# Patient Record
Sex: Female | Born: 1945
Health system: Southern US, Community
[De-identification: ages and names within clinical notes are randomized; demographics above are authoritative.]

## PROBLEM LIST (undated history)

## (undated) DIAGNOSIS — M8580 Other specified disorders of bone density and structure, unspecified site: Secondary | ICD-10-CM

## (undated) DIAGNOSIS — R945 Abnormal results of liver function studies: Secondary | ICD-10-CM

## (undated) DIAGNOSIS — R7989 Other specified abnormal findings of blood chemistry: Secondary | ICD-10-CM

## (undated) DIAGNOSIS — L719 Rosacea, unspecified: Secondary | ICD-10-CM

## (undated) DIAGNOSIS — F329 Major depressive disorder, single episode, unspecified: Secondary | ICD-10-CM

## (undated) DIAGNOSIS — E785 Hyperlipidemia, unspecified: Secondary | ICD-10-CM

## (undated) DIAGNOSIS — I10 Essential (primary) hypertension: Secondary | ICD-10-CM

## (undated) DIAGNOSIS — F32A Depression, unspecified: Secondary | ICD-10-CM

## (undated) HISTORY — PX: NASAL SEPTUM SURGERY: SHX37

## (undated) HISTORY — DX: Major depressive disorder, single episode, unspecified: F32.9

## (undated) HISTORY — DX: Other specified abnormal findings of blood chemistry: R79.89

## (undated) HISTORY — DX: Hyperlipidemia, unspecified: E78.5

## (undated) HISTORY — PX: COLONOSCOPY: SHX174

## (undated) HISTORY — DX: Depression, unspecified: F32.A

## (undated) HISTORY — DX: Other specified disorders of bone density and structure, unspecified site: M85.80

## (undated) HISTORY — DX: Rosacea, unspecified: L71.9

## (undated) HISTORY — DX: Abnormal results of liver function studies: R94.5

## (undated) HISTORY — DX: Essential (primary) hypertension: I10

---
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
## 1980-11-22 HISTORY — PX: ABDOMINAL HYSTERECTOMY: SHX81

## 1992-11-22 HISTORY — PX: ABDOMINOPLASTY: SUR9

## 2005-02-25 ENCOUNTER — Ambulatory Visit: Payer: Self-pay | Admitting: Internal Medicine

## 2005-04-01 ENCOUNTER — Ambulatory Visit: Payer: Self-pay | Admitting: Internal Medicine

## 2005-04-20 ENCOUNTER — Ambulatory Visit: Payer: Self-pay | Admitting: Internal Medicine

## 2005-04-28 ENCOUNTER — Encounter: Admission: RE | Admit: 2005-04-28 | Discharge: 2005-07-27 | Payer: Self-pay | Admitting: Internal Medicine

## 2005-05-04 ENCOUNTER — Other Ambulatory Visit: Admission: RE | Admit: 2005-05-04 | Discharge: 2005-05-04 | Payer: Self-pay | Admitting: Obstetrics and Gynecology

## 2005-11-18 ENCOUNTER — Ambulatory Visit: Payer: Self-pay | Admitting: Internal Medicine

## 2005-12-08 ENCOUNTER — Ambulatory Visit: Payer: Self-pay | Admitting: Internal Medicine

## 2006-03-23 ENCOUNTER — Ambulatory Visit: Payer: Self-pay | Admitting: Internal Medicine

## 2006-07-20 ENCOUNTER — Ambulatory Visit: Payer: Self-pay | Admitting: Internal Medicine

## 2006-07-22 ENCOUNTER — Encounter: Admission: RE | Admit: 2006-07-22 | Discharge: 2006-07-22 | Payer: Self-pay | Admitting: Obstetrics and Gynecology

## 2007-01-13 ENCOUNTER — Encounter: Admission: RE | Admit: 2007-01-13 | Discharge: 2007-01-13 | Payer: Self-pay | Admitting: Internal Medicine

## 2007-04-26 DIAGNOSIS — F329 Major depressive disorder, single episode, unspecified: Secondary | ICD-10-CM

## 2007-04-26 DIAGNOSIS — F3289 Other specified depressive episodes: Secondary | ICD-10-CM

## 2007-04-26 DIAGNOSIS — I10 Essential (primary) hypertension: Secondary | ICD-10-CM | POA: Insufficient documentation

## 2007-04-26 HISTORY — DX: Major depressive disorder, single episode, unspecified: F32.9

## 2007-04-26 HISTORY — DX: Other specified depressive episodes: F32.89

## 2007-10-02 ENCOUNTER — Ambulatory Visit: Payer: Self-pay | Admitting: Internal Medicine

## 2007-10-02 DIAGNOSIS — E785 Hyperlipidemia, unspecified: Secondary | ICD-10-CM

## 2007-10-05 ENCOUNTER — Ambulatory Visit: Payer: Self-pay | Admitting: Internal Medicine

## 2007-10-05 DIAGNOSIS — R740 Nonspecific elevation of levels of transaminase and lactic acid dehydrogenase [LDH]: Secondary | ICD-10-CM

## 2007-10-13 ENCOUNTER — Encounter (INDEPENDENT_AMBULATORY_CARE_PROVIDER_SITE_OTHER): Payer: Self-pay | Admitting: *Deleted

## 2007-10-13 LAB — CONVERTED CEMR LAB
Alkaline Phosphatase: 70 units/L (ref 39–117)
Cholesterol: 241 mg/dL (ref 0–200)
HDL: 44.8 mg/dL (ref >39.0)
Total CHOL/HDL Ratio: 5.4
Triglycerides: 261 mg/dL (ref 0–149)
VLDL: 52 mg/dL — ABNORMAL HIGH (ref 0–40)

## 2007-11-01 ENCOUNTER — Telehealth (INDEPENDENT_AMBULATORY_CARE_PROVIDER_SITE_OTHER): Payer: Self-pay | Admitting: *Deleted

## 2007-11-02 ENCOUNTER — Ambulatory Visit: Payer: Self-pay | Admitting: Internal Medicine

## 2007-11-21 ENCOUNTER — Encounter: Payer: Self-pay | Admitting: Internal Medicine

## 2007-11-27 ENCOUNTER — Ambulatory Visit: Payer: Self-pay | Admitting: Internal Medicine

## 2008-01-12 ENCOUNTER — Ambulatory Visit: Payer: Self-pay | Admitting: Internal Medicine

## 2008-04-01 ENCOUNTER — Ambulatory Visit: Payer: Self-pay | Admitting: Internal Medicine

## 2008-05-03 ENCOUNTER — Encounter: Admission: RE | Admit: 2008-05-03 | Discharge: 2008-05-03 | Payer: Self-pay | Admitting: Internal Medicine

## 2008-08-06 ENCOUNTER — Ambulatory Visit: Payer: Self-pay | Admitting: Internal Medicine

## 2008-08-07 LAB — CONVERTED CEMR LAB
Cholesterol: 230 mg/dL (ref 0–200)
Direct LDL: 146.3 mg/dL
Hgb A1c MFr Bld: 6 % (ref 4.6–6.0)
Triglycerides: 352 mg/dL (ref 0–149)

## 2008-08-08 ENCOUNTER — Encounter (INDEPENDENT_AMBULATORY_CARE_PROVIDER_SITE_OTHER): Payer: Self-pay | Admitting: *Deleted

## 2008-08-12 ENCOUNTER — Ambulatory Visit: Payer: Self-pay | Admitting: Internal Medicine

## 2008-08-12 DIAGNOSIS — R7309 Other abnormal glucose: Secondary | ICD-10-CM | POA: Insufficient documentation

## 2008-08-12 DIAGNOSIS — M899 Disorder of bone, unspecified: Secondary | ICD-10-CM | POA: Insufficient documentation

## 2008-08-12 DIAGNOSIS — M949 Disorder of cartilage, unspecified: Secondary | ICD-10-CM

## 2008-08-26 ENCOUNTER — Encounter: Payer: Self-pay | Admitting: Internal Medicine

## 2008-08-26 ENCOUNTER — Encounter: Admission: RE | Admit: 2008-08-26 | Discharge: 2008-08-26 | Payer: Self-pay | Admitting: Internal Medicine

## 2008-08-30 ENCOUNTER — Ambulatory Visit: Payer: Self-pay | Admitting: Internal Medicine

## 2008-09-06 ENCOUNTER — Telehealth (INDEPENDENT_AMBULATORY_CARE_PROVIDER_SITE_OTHER): Payer: Self-pay | Admitting: *Deleted

## 2008-09-23 ENCOUNTER — Telehealth (INDEPENDENT_AMBULATORY_CARE_PROVIDER_SITE_OTHER): Payer: Self-pay | Admitting: *Deleted

## 2008-09-24 ENCOUNTER — Ambulatory Visit: Payer: Self-pay | Admitting: Internal Medicine

## 2008-09-24 DIAGNOSIS — J45909 Unspecified asthma, uncomplicated: Secondary | ICD-10-CM | POA: Insufficient documentation

## 2008-10-01 ENCOUNTER — Telehealth (INDEPENDENT_AMBULATORY_CARE_PROVIDER_SITE_OTHER): Payer: Self-pay | Admitting: *Deleted

## 2009-02-05 ENCOUNTER — Encounter (INDEPENDENT_AMBULATORY_CARE_PROVIDER_SITE_OTHER): Payer: Self-pay | Admitting: *Deleted

## 2009-03-04 ENCOUNTER — Telehealth (INDEPENDENT_AMBULATORY_CARE_PROVIDER_SITE_OTHER): Payer: Self-pay | Admitting: *Deleted

## 2009-04-14 ENCOUNTER — Ambulatory Visit: Payer: Self-pay | Admitting: Internal Medicine

## 2009-04-18 ENCOUNTER — Encounter (INDEPENDENT_AMBULATORY_CARE_PROVIDER_SITE_OTHER): Payer: Self-pay | Admitting: *Deleted

## 2009-04-22 ENCOUNTER — Ambulatory Visit: Payer: Self-pay | Admitting: Internal Medicine

## 2009-04-22 DIAGNOSIS — E559 Vitamin D deficiency, unspecified: Secondary | ICD-10-CM | POA: Insufficient documentation

## 2009-04-22 LAB — CONVERTED CEMR LAB
ALT: 32 units/L (ref 0–35)
AST: 25 units/L (ref 0–37)
Albumin: 4 g/dL (ref 3.5–5.2)
Bilirubin, Direct: 0.1 mg/dL (ref 0.0–0.3)
HDL: 49.8 mg/dL (ref >39.00)
Hgb A1c MFr Bld: 6 % (ref 4.6–6.5)
Total Protein: 6.8 g/dL (ref 6.0–8.3)

## 2009-09-17 ENCOUNTER — Encounter: Admission: RE | Admit: 2009-09-17 | Discharge: 2009-09-17 | Payer: Self-pay | Admitting: Internal Medicine

## 2009-09-22 ENCOUNTER — Ambulatory Visit: Payer: Self-pay | Admitting: Internal Medicine

## 2009-10-30 ENCOUNTER — Ambulatory Visit: Payer: Self-pay | Admitting: Internal Medicine

## 2010-02-12 ENCOUNTER — Telehealth (INDEPENDENT_AMBULATORY_CARE_PROVIDER_SITE_OTHER): Payer: Self-pay | Admitting: *Deleted

## 2010-03-10 ENCOUNTER — Ambulatory Visit: Payer: Self-pay | Admitting: Internal Medicine

## 2010-03-16 ENCOUNTER — Ambulatory Visit: Payer: Self-pay | Admitting: Internal Medicine

## 2010-03-16 LAB — CONVERTED CEMR LAB
ALT: 23 units/L (ref 0–35)
Alkaline Phosphatase: 59 units/L (ref 39–117)
Cholesterol: 206 mg/dL — ABNORMAL HIGH (ref 0–200)
Direct LDL: 117 mg/dL
Total CHOL/HDL Ratio: 4
Total Protein: 6.9 g/dL (ref 6.0–8.3)
VLDL: 72.8 mg/dL — ABNORMAL HIGH (ref 0.0–40.0)

## 2010-04-14 ENCOUNTER — Telehealth (INDEPENDENT_AMBULATORY_CARE_PROVIDER_SITE_OTHER): Payer: Self-pay | Admitting: *Deleted

## 2010-04-17 ENCOUNTER — Telehealth: Payer: Self-pay | Admitting: Internal Medicine

## 2010-06-01 ENCOUNTER — Ambulatory Visit: Payer: Self-pay | Admitting: Internal Medicine

## 2010-06-11 LAB — CONVERTED CEMR LAB
Albumin: 4 g/dL (ref 3.5–5.2)
Alkaline Phosphatase: 43 units/L (ref 39–117)
Bilirubin, Direct: 0.2 mg/dL (ref 0.0–0.3)
HDL: 46.7 mg/dL (ref >39.00)
LDL Cholesterol: 99 mg/dL (ref 0–99)
Total CHOL/HDL Ratio: 4
VLDL: 32.4 mg/dL (ref 0.0–40.0)

## 2010-06-15 ENCOUNTER — Ambulatory Visit: Payer: Self-pay | Admitting: Internal Medicine

## 2010-11-25 ENCOUNTER — Ambulatory Visit
Admission: RE | Admit: 2010-11-25 | Discharge: 2010-11-25 | Payer: Self-pay | Source: Home / Self Care | Attending: Internal Medicine | Admitting: Internal Medicine

## 2010-12-13 ENCOUNTER — Encounter: Payer: Self-pay | Admitting: Obstetrics and Gynecology

## 2010-12-18 ENCOUNTER — Ambulatory Visit: Admit: 2010-12-18 | Payer: Self-pay | Admitting: Internal Medicine

## 2010-12-18 ENCOUNTER — Ambulatory Visit
Admission: RE | Admit: 2010-12-18 | Discharge: 2010-12-18 | Payer: Self-pay | Source: Home / Self Care | Attending: Internal Medicine | Admitting: Internal Medicine

## 2010-12-18 ENCOUNTER — Encounter: Payer: Self-pay | Admitting: Internal Medicine

## 2010-12-23 NOTE — Assessment & Plan Note (Signed)
Summary: rto 3 months/cbs   Vital Signs:  Patient profile:   65 year old female Weight:      188.4 pounds Pulse rate:   72 / minute Resp:     14 per minute BP sitting:   118 / 72  (left arm) Cuff size:   large  Vitals Entered By: Shonna Chock CMA (June 15, 2010 3:10 PM) CC: Follow-up visit: Discuss Labs (copy given)    CC:  Follow-up visit: Discuss Labs (copy given) .  History of Present Illness:  Hyperlipidemia Follow-Up      This is a 65 year old woman who presents for Hyperlipidemia follow-up on Trilipix alone. Labs reviewed & risks discussed.  The patient reports muscle aches and  some fatigue ( in context of traveling), but denies GI upset, abdominal pain, flushing, itching, constipation, and diarrhea.  Other symptoms include pedal edema.  The patient denies the following symptoms: chest pain/pressure, exercise intolerance, dypsnea, palpitations, and syncope.  Compliance with medications (by patient report) has been near 100%.  Dietary compliance has been affected by the traveling.  The patient reports exercising  5X/ week.    Allergies: 1)  ! Axid  Review of Systems Derm:  Complains of lesion(s); Lesion L popliteal space X 4 weeks . Cort Aid not helping.  Physical Exam  General:  well-nourished; alert,appropriate and cooperative throughout examination Lungs:  Normal respiratory effort, chest expands symmetrically. Lungs are clear to auscultation, no crackles or wheezes. Heart:  normal rate, regular rhythm, no gallop, no rub, no JVD, and grade 1 /6 systolic murmur.   Pulses:  R and L carotid,radial,dorsalis pedis and posterior tibial pulses are full and equal bilaterally Skin:  4x 8 mm papule L popliteal  space   Impression & Recommendations:  Problem # 1:  HYPERLIPIDEMIA (ICD-272.4)  The following medications were removed from the medication list:    Crestor 20 Mg Tabs (Rosuvastatin calcium) .Marland Kitchen... Daily as directed Her updated medication list for this problem  includes:    Trilipix 135 Mg Cpdr (Choline fenofibrate) .Marland Kitchen... 1 once daily  Problem # 2:  FASTING HYPERGLYCEMIA (ICD-790.29) A1c now 6.1%  Complete Medication List: 1)  Fish Oil Daily  2)  Vit D3 2000 Iuqd  3)  Caltrate 600 Plus D  4)  Coq10  5)  Trilipix 135 Mg Cpdr (Choline fenofibrate) .Marland Kitchen.. 1 once daily 6)  B Complex Vitamins Caps (B complex vitamins) .Marland Kitchen.. 1 by mouth once daily  Patient Instructions: 1)   Consume LESS THAN 30 grams of HFCS sugar/ day as discussed.See Dermatologist about the leg lesion.Please schedule a follow-up appointment in 6 months. 2)  Lipid Panel prior to visit, ICD-9:272.4 3)  HbgA1C prior to visit, ICD-9:790.29. Prescriptions: TRILIPIX 135 MG CPDR (CHOLINE FENOFIBRATE) 1 once daily  #90 x 1   Entered and Authorized by:   Marga Melnick MD   Signed by:   Marga Melnick MD on 06/15/2010   Method used:   Print then Give to Patient   RxID:   [id number]

## 2010-12-23 NOTE — Progress Notes (Signed)
Summary: pt lost hard copy of rx  Phone Note Refill Request Message from:  Fax from Pharmacy on Apr 14, 2010 2:08 PM  Refills Requested: Medication #1:  TRILIPIX 135 MG CPDR 1 once daily. fax [facility] - "pt is requesting cholestrol med has lost hard copy dr hopper gave her - pt saying it is not crestor" per ov [phone] patient was given rx for trilipix   Method Requested: Fax to Local Pharmacy Initial call taken by: Okey Regal [place],  Apr 14, 2010 2:13 PM    Prescriptions: TRILIPIX 135 MG CPDR (CHOLINE FENOFIBRATE) 1 once daily  #30 x 2   Entered by:   Jeremy Johann CMA   Authorized by:   Marga Melnick MD   Signed by:   Jeremy Johann CMA on 04/14/2010   Method used:   Re-Faxed to ...       [facility] [phone]* (retail)       [address] [place], [place]  [address]       Ph: [phone]       Fax: [phone]   RxID:   [id number]

## 2010-12-23 NOTE — Progress Notes (Signed)
Summary: REACTION TO MED  Phone Note Refill Request Call back at Home Phone [phone]   Refills Requested: Medication #1:  TRILIPIX 135 MG CPDR 1 once daily. PT LEFT VM STATING THAT MED IS CAUSING DIZZINESS, WEAKNESS, MUSCLE ACHE AND PAIN IN LEGS AND ARMS. PT HAS D/C MED FOR NOW PLS ADVISE  Initial call taken by: Jeremy Johann CMA,  Apr 17, 2010 12:24 PM  Follow-up for Phone Call        recommend referral to Lipid Clinic Follow-up by: Marga Melnick MD,  Apr 17, 2010 2:13 PM  Additional Follow-up for Phone Call Additional follow up Details #1::        pt aware, awaiting appt info................Marland KitchenFelecia Deloach CMA  Apr 17, 2010 2:47 PM

## 2010-12-23 NOTE — Progress Notes (Signed)
Summary: Triage: Impetigo  Phone Note Call from Patient Call back at Home Phone [phone]   Caller: Patient Summary of Call: Message left on VM: Patient with Impetigo, ? if she should see Dr.Hopper or her Dermatologist.  I called patient and recommended that she start with her Dermatologist since that is a skin conern. Patient to call back if additional questions or concerns.   Sharon Bauer  February 12, 2010 1:50 PM

## 2010-12-23 NOTE — Assessment & Plan Note (Signed)
Summary: 3 MONTH FOLLOWUP, AND TO DISCUSS LAABS///SPH   Vital Signs:  Patient profile:   65 year old female Weight:      188.2 pounds Pulse rate:   72 / minute Resp:     15 per minute BP sitting:   124 / 80  (left arm) Cuff size:   large  Vitals Entered By: Shonna Chock (March 16, 2010 3:26 PM) CC: Follow-up visit: Discuss Labs (copy given) Comments REVIEWED MED LIST, PATIENT AGREED DOSE AND INSTRUCTION CORRECT    CC:  Follow-up visit: Discuss Labs (copy given).  History of Present Illness: Labs reviewed , risks discussed. Food &  drink label reading  discussed . She drinks fruit smoothies & sweetened tea. She is no longer going to [employer] ; CVE as walking & swimming 5X/ week. On Crestor 20 mg once weekly LDL has  dropped from 179 to 117, but TG have risen to 364 from 260.  Allergies: 1)  ! Axid  Family History: Family History DM; M uncle MI @ 80; M aunt MI @ 63; M uncle MI @ 35; MGF DM  Social History: Alcohol use-yes-occasionally No specific diet  Review of Systems CV:  Denies chest pain or discomfort, leg cramps with exertion, palpitations, and shortness of breath with exertion.  Physical Exam  General:  well-nourished; alert,appropriate and cooperative throughout examination Heart:  Normal rate and regular rhythm. S1 and S2 normal without gallop, murmur, click, rub.S4 Pulses:  R and L carotid,radial,dorsalis pedis and posterior tibial pulses are full and equal bilaterally Skin:  Intact without suspicious lesions or rashes Psych:  memory intact for recent and remote, normally interactive, and good eye contact.     Impression & Recommendations:  Problem # 1:  HYPERLIPIDEMIA (ICD-272.4) LDL 117 ; TG 364 Her updated medication list for this problem includes:    Crestor 20 Mg Tabs (Rosuvastatin calcium) .Marland Kitchen... Daily as directed    Trilipix 135 Mg Cpdr (Choline fenofibrate) .Marland Kitchen... 1 once daily  Complete Medication List: 1)  Diltiazem Hcl Cr 240 Mg Cp24  (Diltiazem hcl) .Marland Kitchen.. 1 by mouth once daily 2)  Fish Oil Daily  3)  Vit D3 2000 Iuqd  4)  Caltrate 600 Plus D  5)  Coq10  6)  Xango  7)  Crestor 20 Mg Tabs (Rosuvastatin calcium) .... Daily as directed 8)  Trilipix 135 Mg Cpdr (Choline fenofibrate) .Marland Kitchen.. 1 once daily  Patient Instructions: 1)  Hold Crestor 20 mg once weekly.Consume < 25  grams of sugar from  HFCS  sources as discussed. 2)  Please schedule a follow-up appointment in 3 months. 3)  Hepatic Panel prior to visit, ICD-9:995.20,790.4 4)  Lipid Panel prior to visit, ICD-9:272.4 5)  HbgA1C prior to visit, ICD-9:790.29 Prescriptions: TRILIPIX 135 MG CPDR (CHOLINE FENOFIBRATE) 1 once daily  #30 x 2   Entered and Authorized by:   Marga Melnick MD   Signed by:   Marga Melnick MD on 03/16/2010   Method used:   Print then Give to Patient   RxID:   [id number]

## 2010-12-24 NOTE — Assessment & Plan Note (Signed)
Summary: both ears "oozy" and starting to hurt///sph   Vital Signs:  Patient profile:   65 year old female Height:      64.5 inches Weight:      187 pounds BMI:     31.72 Temp:     98.4 degrees F oral Pulse rate:   64 / minute Resp:     15 per minute BP sitting:   128 / 84  (left arm) Cuff size:   large  Vitals Entered By: Shonna Chock CMA (November 25, 2010 1:40 PM) CC: Ear concerns: itchy, ozzy, and crusted over , Ear pain   CC:  Ear concerns: itchy, ozzy, and crusted over , and Ear pain.  History of Present Illness:      This is a 65 year old woman who presents with dull  R ear pain as of 11/24/2010 .  The patient also reports  bilateral ear discharge X 1-2  weeks  and slight  tinnitus, but denies hearing loss, fever, sinus pain, and nasal discharge. She had dry & itching canals 2 months ago; a  topical agent was Rxed by Vaughan Sine.  Subsequently she has  noted wet, "oozy" discharge followed by crystallizing of  material oin the canals.  She has occasional  dizziness;the patient denies vertigo.     Current Medications (verified): 1)  Fish Oil Daily 2)  Vit D3 2000 Iuqd 3)  Caltrate 600 Plus D 4)  Coq10 5)  Trilipix 135 Mg Cpdr (Choline Fenofibrate) .Marland Kitchen.. 1 Once Daily 6)  B Complex Vitamins  Caps (B Complex Vitamins) .Marland Kitchen.. 1 By Mouth Once Daily  Allergies: 1)  ! Axid  Review of Systems Derm:  Denies changes in nail beds, dryness, and hair loss.  Physical Exam  General:  well-nourished;alert,appropriate and cooperative throughout examination Ears:  External ear exam shows no significant lesions or deformities.  Otoscopic examination reveals clear canals, tympanic membranes are intact bilaterally without bulging, retraction, inflammation or discharge. Hearing is grossly normal bilaterally to whisper @ 6 ft.Rinne  (BC>AC) and Weber  both normal.   Nose:  External nasal examination shows no deformity or inflammation. Nasal mucosa are pink and moist without lesions or exudates. Septal  dislocation Mouth:  Oral mucosa and oropharynx without lesions or exudates.  Teeth in good repair. Cervical Nodes:  No lymphadenopathy noted Axillary Nodes:  No palpable lymphadenopathy   Impression & Recommendations:  Problem # 1:  EAR PAIN, RIGHT (ICD-388.70) negative exam  Complete Medication List: 1)  Fish Oil Daily  2)  Vit D3 2000 Iuqd  3)  Caltrate 600 Plus D  4)  Coq10  5)  Trilipix 135 Mg Cpdr (Choline fenofibrate) .Marland Kitchen.. 1 once daily 6)  B Complex Vitamins Caps (B complex vitamins) .Marland Kitchen.. 1 by mouth once daily  Patient Instructions: 1)  Mix 1 part Eucerin to 1 part OTC Cort Aid once daily - two times a day as needed for dry & itching canals. Shower with moisturing gel. T gel shampoo 2-3 X /week  for  itchy scalp. At next fasting Lipid study ; have a  Boston Heart Panel ( 1304X). Code : 272.4.   Orders Added: 1)  Est. Patient Level III [[address]]

## 2011-01-12 ENCOUNTER — Encounter: Payer: Self-pay | Admitting: Internal Medicine

## 2011-01-12 ENCOUNTER — Encounter (INDEPENDENT_AMBULATORY_CARE_PROVIDER_SITE_OTHER): Payer: Medicare Other | Admitting: Internal Medicine

## 2011-01-12 DIAGNOSIS — E785 Hyperlipidemia, unspecified: Secondary | ICD-10-CM

## 2011-01-12 DIAGNOSIS — I10 Essential (primary) hypertension: Secondary | ICD-10-CM

## 2011-01-12 DIAGNOSIS — Z0000 Encounter for general adult medical examination without abnormal findings: Secondary | ICD-10-CM

## 2011-01-18 ENCOUNTER — Encounter (INDEPENDENT_AMBULATORY_CARE_PROVIDER_SITE_OTHER): Payer: Self-pay | Admitting: *Deleted

## 2011-01-19 NOTE — Letter (Signed)
Summary: [employer] Personalized Risk Profile  [employer] Personalized Risk Profile   Imported By: Maryln Gottron 01/15/2011 09:48:57  _____________________________________________________________________  External Attachment:    Type:   Image     Comment:   External Document

## 2011-01-19 NOTE — Assessment & Plan Note (Signed)
Summary: first medicare cpx,discuss labs//sph   Vital Signs:  Patient profile:   65 year old female Height:      64.5 inches Weight:      187.6 pounds BMI:     31.82 Temp:     98.4 degrees F oral Pulse rate:   63 / minute Resp:     14 per minute BP sitting:   152 / 95  (left arm) Cuff size:   large  Vitals Entered By: Shonna Chock CMA (January 12, 2011 1:03 PM) CC: CPX and discuss labs (Patient with [facility])  Vision Screening:Left eye with correction: 20 / 50 Right eye with correction: 20 / 40 Both eyes with correction: 20 / 40       Vision Comments: Patient had recent eye exam and RX changed for glasses. Eyes tested today with old RX  Vision Entered By: Shonna Chock CMA (January 12, 2011 1:12 PM)   CC:  CPX and discuss labs (Patient with [facility]).  History of Present Illness: Here for Medicare AWV: 1.Risk factors based on Past M, S, F history:see Diagnoses; chart updated 2.Physical Activities:walking 4-5 X/ week & Body Flow 1-2X/ week (Tai Chi, Palates, yoga) 3.Depression/mood: no issues 4.Hearing: whisper heard @ 6 ft 5.ADL's: no limitations 6.Fall Risk: balance improved with #2 7.Home Safety: safety proofed 8.Height, weight, &visual acuity:see VS 9.Counseling: POA & Living may need updated 10.Labs ordered based on risk factors: [facility] Heart Pane reviewed & risks discussed 11.Referral Coordination: mammograms schedued; ?  colonoscopy status  12.  Care Plan: see Intructions 13.Cognitive Assessment: Oriented X 3; memory & recalll  good   ; "WORLD" spelled backwards; mood & affect normal.    Hyperlipidemia Follow-Up:She reports muscle aches and flushing ( not on Niacin; she has rosacea) , but denies GI upset, abdominal pain, itching, constipation, diarrhea, and fatigue.  The patient denies the following symptoms: chest pain/pressure, exercise intolerance, dypsnea, palpitations, syncope, and pedal edema.  Compliance with medications (by patient  report) has been near 100%.  Dietary compliance has been poor.  Adjunctive measures currently used by the patient include fiber, fish oil supplements, and Co-Q 10.     See BP; inpast she had been on meds for HTN.  The patient reports some frontal  headaches, but denies lightheadedness and urinary frequency.  Adjunctive measures currently used by the patient include salt restriction.  BP not monitored @ home.  Preventive Screening-Counseling & Management  Alcohol-Tobacco     Alcohol drinks/day: <1     Smoking Status: never  Caffeine-Diet-Exercise     Caffeine use/day: 1 cup green tea / day  Hep-HIV-STD-Contraception     Dental Visit-last 6 months yes     Sun Exposure-Excessive: no  Safety-Violence-Falls     Seat Belt Use: yes     Smoke Detectors: yes      Blood Transfusions:  no.        Travel History:  2002 [place].    Current Medications (verified): 1)  Fish Oil Daily 2)  Vit D3 2000 Iuqd 3)  Caltrate 600 Plus D 4)  Coq10 5)  Trilipix 135 Mg Cpdr (Choline Fenofibrate) .Marland Kitchen.. 1 Once Daily 6)  B Complex Vitamins  Caps (B Complex Vitamins) .Marland Kitchen.. 1 By Mouth Once Daily 7)  Multivitamins  Tabs (Multiple Vitamin) .Marland Kitchen.. 1 By Mouth Once Daily  Allergies: 1)  ! Axid  Past History:  Past Medical History: Depression, PMH of situational ( divorce) Hypertension Hyperlipidemia: LDL 179 ( 3399/ 2504), HDL  43, TG 267. Elevated LFTs, PMH of  Osteopenia: T score -2.4 @ [facility] spine 2006 ; Rosacea   Past Surgical History:  G 2 P 2 , Caesarean section X 1 Hysterectomy  1982  for fibroids Tonsillectomy  1960 Abdominoplasty 1994  Family History:  M uncle: MI @ 28; M aunt:  MI @ 60; M uncle: MI @ 56; MGF: DM; PGM: colon cancer;2 P aunts: asthma;M aunt : Alzheimer's Father: MI in 41s, CHF,COPD, HTN Mother: HTN, dyslipidemia, Osteoporosis, OA Siblings: sister : elevated  lipids  Social History: Alcohol use-yes: rarely Divorced Caffeine use/day:  1 cup green tea / day Dental Care w/in 6  mos.:  yes Sun Exposure-Excessive:  no Seat Belt Use:  yes Blood Transfusions:  no  Review of Systems  The patient denies anorexia, fever, weight loss, weight gain, vision loss, decreased hearing, hoarseness, prolonged cough, hemoptysis, melena, hematochezia, severe indigestion/heartburn, hematuria, suspicious skin lesions, unusual weight change, abnormal bleeding, enlarged lymph nodes, and angioedema.         Occasional choking especially with black papper  Physical Exam  General:  well-nourished;alert,appropriate and cooperative throughout examination Head:  Normocephalic and atraumatic without obvious abnormalities. Eyes:  No corneal or conjunctival inflammation noted. Perrla. Funduscopic exam benign, without hemorrhages, exudates or papilledema.  Ears:  External ear exam shows no significant lesions or deformities.  Otoscopic examination reveals clear canals, tympanic membranes are intact bilaterally without bulging, retraction, inflammation or discharge. Hearing is grossly normal bilaterally. Nose:  External nasal examination shows no deformity or inflammation. Nasal mucosa are pink and moist without lesions or exudates. Mouth:  Oral mucosa and oropharynx without lesions or exudates.  Teeth in good repair. Venous nevi of lip Neck:  No deformities, masses, or tenderness noted. Lungs:  Normal respiratory effort, chest expands symmetrically. Lungs are clear to auscultation, no crackles or wheezes. Heart:  normal rate, regular rhythm, no gallop, no rub, no JVD, no HJR, and grade  1/6 systolic murmur.   Abdomen:  Bowel sounds positive,abdomen soft and non-tender without masses, organomegaly or hernias noted. Genitalia:  Dr Rana Snare Msk:  lordosis of upper back Pulses:  R and L carotid,radial,dorsalis pedis and posterior tibial pulses are full and equal bilaterally Extremities:  No clubbing, cyanosis, edema, or deformity noted with normal full range of motion of all joints.   Neurologic:  alert  & oriented X3 and DTRs symmetrical and normal.   Skin:  Intact without suspicious lesions or rashes Cervical Nodes:  No lymphadenopathy noted Axillary Nodes:  No palpable lymphadenopathy Psych:  memory intact for recent and remote, normally interactive, good eye contact, not anxious appearing, and not depressed appearing.     Impression & Recommendations:  Problem # 1:  PREVENTIVE HEALTH CARE (ICD-V70.0)  Orders: Medicare -1st Annual Wellness Visit [phone])  Problem # 2:  HYPERLIPIDEMIA (ICD-272.4)  Her updated medication list for this problem includes:    Trilipix 135 Mg Cpdr (Choline fenofibrate) .Marland Kitchen... 1 once daily  Problem # 3:  HYPERTENSION (ICD-401.9)  Orders: EKG w/ Interpretation (93000)  Her updated medication list for this problem includes:    Ramipril 5 Mg Caps (Ramipril) .Marland Kitchen... 1 once daily  Problem # 4:  REACTIVE AIRWAY DISEASE (ICD-493.90) quiescent  Problem # 5:  VITAMIN D DEFICIENCY (ICD-268.9) monitor vit D annually  Problem # 6:  OSTEOPENIA (ICD-733.90) BMD every 25 months  Complete Medication List: 1)  Fish Oil Daily  2)  Vit D3 2000 Iuqd  3)  Caltrate 600 Plus D  4)  Coq10  5)  Trilipix 135 Mg Cpdr (Choline fenofibrate) .Marland Kitchen.. 1 once daily 6)  B Complex Vitamins Caps (B complex vitamins) .Marland Kitchen.. 1 by mouth once daily 7)  Multivitamins Tabs (Multiple vitamin) .Marland Kitchen.. 1 by mouth once daily 8)  Ramipril 5 Mg Caps (Ramipril) .Marland Kitchen.. 1 once daily  Other Orders: Gastroenterology Referral (GI)   Patient Instructions: 1)  Take calcium  600 mg twice a day +Vitamin D daily.Check vit D level annually. 2)  It is important that you exercise regularly at least 20 minutes 5 times a week. If you develop chest pain, have severe difficulty breathing, or feel very tired , stop exercising immediately and seek medical attention. 3)  Take an  coated 81 mg Aspirin every day.BMD every 25 months. Avoid HFCS sugar & follow a low carb program as discussed. 4)  Please schedule a  follow-up appointment in 6 months for : vit D level, 268.9; 5)  Hepatic Panel ,  995.20; BUN, creat, 401.9; 6)  Lipid Panel , :272.4 7)  Check your Blood Pressure regularly. If it is above: 135/85 ON AVERAGE  you should make an appointment. Prescriptions: TRILIPIX 135 MG CPDR (CHOLINE FENOFIBRATE) 1 once daily  #90 x 1   Entered and Authorized by:   Marga Melnick MD   Signed by:   Marga Melnick MD on 01/12/2011   Method used:   Print then Give to Patient   RxID:   [id number] RAMIPRIL 5 MG CAPS (RAMIPRIL) 1 once daily  #30 x 5   Entered and Authorized by:   Marga Melnick MD   Signed by:   Marga Melnick MD on 01/12/2011   Method used:   Print then Give to Patient   RxID:   [id number]    Orders Added: 1)  Medicare -1st Annual Wellness Visit [G0438] 2)  Est. Patient Level III [84696] 3)  EKG w/ Interpretation [93000] 4)  Gastroenterology Referral [GI]

## 2011-01-28 NOTE — Letter (Signed)
Summary: Pre Visit Letter Revised  [facility]  [address] [place], [place] [address]   Phone: [phone]  Fax: [phone]        01/18/2011 MRN: [id number] [facility] [address] [place], [place]  [address]  [place]             Procedure Date:  02-17-11           Direct Colon--Dr. Christella Hartigan   Welcome to the Gastroenterology Division at [facility].    You are scheduled to see a nurse for your pre-procedure visit on 02-03-11 at 1:00p.m. on the 3rd floor at [facility], [address]  We ask that you try to arrive at our office 15 minutes prior to your appointment time to allow for check-in.  Please take a minute to review the attached form.  If you answer "Yes" to one or more of the questions on the first page, we ask that you call the person listed at your earliest opportunity.  If you answer "No" to all of the questions, please complete the rest of the form and bring it to your appointment.    Your nurse visit will consist of discussing your medical and surgical history, your immediate family medical history, and your medications.   If you are unable to list all of your medications on the form, please bring the medication bottles to your appointment and we will list them.  We will need to be aware of both prescribed and over the counter drugs.  We will need to know exact dosage information as well.    Please be prepared to read and sign documents such as consent forms, a financial agreement, and acknowledgement forms.  If necessary, and with your consent, a friend or relative is welcome to sit-in on the nurse visit with you.  Please bring your insurance card so that we may make a copy of it.  If your insurance requires a referral to see a specialist, please bring your referral form from your primary care physician.  No co-pay is required for this nurse visit.     If you cannot keep your appointment, please call [phone] to cancel or reschedule  prior to your appointment date.  This allows [place] the opportunity to schedule an appointment for another patient in need of care.    Thank you for choosing [facility] for your medical needs.  We appreciate the opportunity to care for you.  Please visit [place] at our website  to learn more about our practice.  Sincerely, The Gastroenterology Division

## 2011-02-17 ENCOUNTER — Other Ambulatory Visit: Payer: Medicare Other | Admitting: Gastroenterology

## 2011-03-12 ENCOUNTER — Other Ambulatory Visit: Payer: Medicare Other | Admitting: Gastroenterology

## 2011-03-15 ENCOUNTER — Other Ambulatory Visit: Payer: Self-pay | Admitting: Internal Medicine

## 2011-03-16 ENCOUNTER — Encounter: Payer: Self-pay | Admitting: *Deleted

## 2011-03-16 ENCOUNTER — Ambulatory Visit (INDEPENDENT_AMBULATORY_CARE_PROVIDER_SITE_OTHER): Payer: Medicare Other | Admitting: Family Medicine

## 2011-03-16 DIAGNOSIS — H60549 Acute eczematoid otitis externa, unspecified ear: Secondary | ICD-10-CM

## 2011-03-16 DIAGNOSIS — J310 Chronic rhinitis: Secondary | ICD-10-CM

## 2011-03-16 DIAGNOSIS — H60509 Unspecified acute noninfective otitis externa, unspecified ear: Secondary | ICD-10-CM

## 2011-03-16 MED ORDER — FLUOCINOLONE ACETONIDE 0.01 % OT OIL: 5.0000 [drp] | TOPICAL_OIL | Freq: Two times a day (BID) | OTIC | Status: DC

## 2011-03-16 MED ORDER — FLUTICASONE PROPIONATE 50 MCG/ACT NA SUSP: 2.0000 | Freq: Every day | NASAL | Status: DC

## 2011-03-16 NOTE — Patient Instructions (Signed)
This appears to be allergy related Start the nasal spray- 2 sprays each nostril Use the ear drops for 2 weeks and then as needed for itching and drainage Start the allergy meds ~ mid February - mid may and then mid august until the 1st frost to cover for the spring and fall allergy seasons Call with any questions or concerns Sherri Rad in there!

## 2011-03-16 NOTE — Progress Notes (Signed)
  Subjective:    Patient ID: Sharon Bauer, female    DOB: 02/08/1946, 65 y.o.   MRN: [id number]  HPI Ears 'oozing'- has had this problem previously (seen in January for this).  Reports ears are now 'stopped up from the liquid'.  Reports in last few weeks drainage has increased.  Drainage ranges from clear to pale yellow.  When she sticks a tissue in her ear she will note a spot of blood on the tissue.  Will have pain w/ manipulation of outer ear.  No fevers.  + PND.   Review of Systems For ROS see HPI     Objective:   Physical Exam  Constitutional: She appears well-developed and well-nourished. No distress.  HENT:  Head: Normocephalic and atraumatic.  Right Ear: Tympanic membrane normal.  Left Ear: Tympanic membrane normal.  Nose: Mucosal edema, rhinorrhea and septal deviation present.       External ears and proximal canals show eczematous changes w/ serous drainage and crusting.  + excoriations w/out evidence of infxn  + PND and turbinate edema          Assessment & Plan:

## 2011-03-17 DIAGNOSIS — J310 Chronic rhinitis: Secondary | ICD-10-CM | POA: Insufficient documentation

## 2011-03-17 DIAGNOSIS — H60549 Acute eczematoid otitis externa, unspecified ear: Secondary | ICD-10-CM | POA: Insufficient documentation

## 2011-03-17 NOTE — Assessment & Plan Note (Signed)
Pt's PND and turbinate edema are most likely due to seasonal allergies.  Start nasal steroid.

## 2011-03-17 NOTE — Assessment & Plan Note (Signed)
Pt's serous drainage and 'crusting' appear to be eczematous in nature.  TMs normal.  No evidence of otitis externa.  Start steroid ear drops for sxs relief.  Reviewed supportive care and red flags that should prompt return.  Pt expressed understanding and is in agreement w/ plan.

## 2011-04-30 ENCOUNTER — Other Ambulatory Visit: Payer: Self-pay | Admitting: Internal Medicine

## 2011-04-30 ENCOUNTER — Encounter: Payer: Self-pay | Admitting: Internal Medicine

## 2011-04-30 DIAGNOSIS — Z1231 Encounter for screening mammogram for malignant neoplasm of breast: Secondary | ICD-10-CM

## 2011-05-07 ENCOUNTER — Telehealth: Payer: Self-pay

## 2011-05-07 ENCOUNTER — Ambulatory Visit
Admission: RE | Admit: 2011-05-07 | Discharge: 2011-05-07 | Disposition: A | Discharge status: Home / Self Care | Payer: Medicare Other | Source: Ambulatory Visit | Attending: Internal Medicine | Admitting: Internal Medicine

## 2011-05-07 DIAGNOSIS — Z1231 Encounter for screening mammogram for malignant neoplasm of breast: Secondary | ICD-10-CM

## 2011-05-07 DIAGNOSIS — Z780 Asymptomatic menopausal state: Secondary | ICD-10-CM

## 2011-05-07 NOTE — Telephone Encounter (Signed)
Left msg on voicemail that she was due for bone density would like to scheduled has pending appt for 06/2011 sent to the [facility]

## 2011-06-01 ENCOUNTER — Ambulatory Visit
Admission: RE | Admit: 2011-06-01 | Discharge: 2011-06-01 | Disposition: A | Discharge status: Home / Self Care | Payer: Medicare Other | Source: Ambulatory Visit | Attending: Internal Medicine | Admitting: Internal Medicine

## 2011-06-01 ENCOUNTER — Ambulatory Visit (AMBULATORY_SURGERY_CENTER): Payer: Medicare Other

## 2011-06-01 VITALS — Ht 65.0 in | Wt 183.9 lb

## 2011-06-01 DIAGNOSIS — Z800 Family history of malignant neoplasm of digestive organs: Secondary | ICD-10-CM

## 2011-06-01 DIAGNOSIS — Z780 Asymptomatic menopausal state: Secondary | ICD-10-CM

## 2011-06-01 MED ORDER — PEG-KCL-NACL-NASULF-NA ASC-C 100 G PO SOLR: 1.0000 | Freq: Once | ORAL | Status: AC

## 2011-06-15 ENCOUNTER — Encounter: Payer: Self-pay | Admitting: Internal Medicine

## 2011-06-17 ENCOUNTER — Other Ambulatory Visit: Payer: Medicare Other | Admitting: Internal Medicine

## 2011-07-13 ENCOUNTER — Other Ambulatory Visit: Payer: Medicare Other

## 2011-07-15 ENCOUNTER — Ambulatory Visit (AMBULATORY_SURGERY_CENTER): Payer: Medicare Other | Admitting: Internal Medicine

## 2011-07-15 ENCOUNTER — Other Ambulatory Visit: Payer: Self-pay | Admitting: Internal Medicine

## 2011-07-15 ENCOUNTER — Encounter: Payer: Self-pay | Admitting: Internal Medicine

## 2011-07-15 VITALS — BP 158/73 | HR 64 | Temp 98.3°F | Resp 20 | Ht 65.0 in | Wt 180.0 lb

## 2011-07-15 DIAGNOSIS — Z1211 Encounter for screening for malignant neoplasm of colon: Secondary | ICD-10-CM

## 2011-07-15 MED ORDER — SODIUM CHLORIDE 0.9 % IV SOLN: 500.0000 mL | INTRAVENOUS | Status: DC

## 2011-07-15 NOTE — Patient Instructions (Signed)
Discharge instructions reviewed with patient and care partner.  Impressions/Recommendations:  Diverticulosis (handout given)  High fiber diet handout given.  Repeat exam in 10 years.  Continue medications as you were taking them prior to your procedure.

## 2011-07-16 ENCOUNTER — Telehealth: Payer: Self-pay | Admitting: *Deleted

## 2011-07-16 NOTE — Telephone Encounter (Signed)

## 2011-07-20 ENCOUNTER — Ambulatory Visit: Payer: Medicare Other | Admitting: Internal Medicine

## 2011-08-19 ENCOUNTER — Other Ambulatory Visit: Payer: Self-pay | Admitting: Internal Medicine

## 2011-08-19 DIAGNOSIS — E559 Vitamin D deficiency, unspecified: Secondary | ICD-10-CM

## 2011-08-19 DIAGNOSIS — T887XXA Unspecified adverse effect of drug or medicament, initial encounter: Secondary | ICD-10-CM

## 2011-08-19 DIAGNOSIS — I10 Essential (primary) hypertension: Secondary | ICD-10-CM

## 2011-08-19 DIAGNOSIS — E785 Hyperlipidemia, unspecified: Secondary | ICD-10-CM

## 2011-08-20 ENCOUNTER — Other Ambulatory Visit: Payer: Medicare Other

## 2011-08-20 DIAGNOSIS — Z0289 Encounter for other administrative examinations: Secondary | ICD-10-CM

## 2011-08-20 NOTE — Progress Notes (Signed)
12  

## 2011-08-27 ENCOUNTER — Ambulatory Visit: Payer: Medicare Other | Admitting: Internal Medicine

## 2011-09-10 ENCOUNTER — Telehealth: Payer: Self-pay | Admitting: Internal Medicine

## 2011-09-10 ENCOUNTER — Other Ambulatory Visit: Payer: Self-pay | Admitting: Internal Medicine

## 2011-09-10 NOTE — Telephone Encounter (Signed)
The pt called requesting lab work on oct 31 for 4 month follow up.  Needs orders

## 2011-09-10 NOTE — Telephone Encounter (Signed)
Copied from 01-12-11 patient instructions:   Please schedule a follow-up appointment in 6 months for :  vit D level, 268.9; Hepatic Panel , 995.20; BUN, creat, 401.9;  Lipid Panel , :272.4

## 2011-09-22 ENCOUNTER — Other Ambulatory Visit: Payer: Medicare Other

## 2011-09-27 ENCOUNTER — Other Ambulatory Visit (INDEPENDENT_AMBULATORY_CARE_PROVIDER_SITE_OTHER): Payer: Medicare Other

## 2011-09-27 DIAGNOSIS — E559 Vitamin D deficiency, unspecified: Secondary | ICD-10-CM

## 2011-09-27 DIAGNOSIS — T887XXA Unspecified adverse effect of drug or medicament, initial encounter: Secondary | ICD-10-CM

## 2011-09-27 DIAGNOSIS — E785 Hyperlipidemia, unspecified: Secondary | ICD-10-CM

## 2011-09-27 DIAGNOSIS — I10 Essential (primary) hypertension: Secondary | ICD-10-CM

## 2011-09-27 LAB — CREATININE, SERUM: Creatinine, Ser: 1 mg/dL (ref 0.4–1.2)

## 2011-09-27 LAB — BUN: BUN: 20 mg/dL (ref 6–23)

## 2011-09-27 LAB — LIPID PANEL
HDL: 56.3 mg/dL (ref >39.00)
Triglycerides: 131 mg/dL (ref 0.0–149.0)

## 2011-09-27 LAB — HEPATIC FUNCTION PANEL
AST: 35 U/L (ref 0–37)
Total Bilirubin: 0.6 mg/dL (ref 0.3–1.2)

## 2011-09-28 ENCOUNTER — Other Ambulatory Visit: Payer: Self-pay | Admitting: Internal Medicine

## 2011-09-28 DIAGNOSIS — T887XXA Unspecified adverse effect of drug or medicament, initial encounter: Secondary | ICD-10-CM

## 2011-09-28 DIAGNOSIS — E559 Vitamin D deficiency, unspecified: Secondary | ICD-10-CM

## 2011-09-28 DIAGNOSIS — E785 Hyperlipidemia, unspecified: Secondary | ICD-10-CM

## 2011-09-28 DIAGNOSIS — I10 Essential (primary) hypertension: Secondary | ICD-10-CM

## 2011-09-28 LAB — VITAMIN D 25 HYDROXY (VIT D DEFICIENCY, FRACTURES): Vit D, 25-Hydroxy: 49 ng/mL (ref 30–89)

## 2011-09-29 ENCOUNTER — Other Ambulatory Visit: Payer: Medicare Other

## 2011-09-29 ENCOUNTER — Ambulatory Visit: Payer: Medicare Other | Admitting: Internal Medicine

## 2011-09-29 NOTE — Progress Notes (Signed)
Labs only

## 2011-10-06 ENCOUNTER — Encounter: Payer: Self-pay | Admitting: Internal Medicine

## 2011-10-07 ENCOUNTER — Ambulatory Visit (INDEPENDENT_AMBULATORY_CARE_PROVIDER_SITE_OTHER): Payer: Medicare Other | Admitting: Internal Medicine

## 2011-10-07 VITALS — BP 114/72 | HR 63 | Temp 98.6°F | Wt 182.4 lb

## 2011-10-07 DIAGNOSIS — I10 Essential (primary) hypertension: Secondary | ICD-10-CM

## 2011-10-07 DIAGNOSIS — R1310 Dysphagia, unspecified: Secondary | ICD-10-CM

## 2011-10-07 DIAGNOSIS — E781 Pure hyperglyceridemia: Secondary | ICD-10-CM

## 2011-10-07 MED ORDER — TRILIPIX 135 MG PO CPDR: 135.0000 mg | DELAYED_RELEASE_CAPSULE | Freq: Every day | ORAL | Status: DC

## 2011-10-07 NOTE — Progress Notes (Signed)
  Subjective:    Patient ID: Sharon Bauer, female    DOB: 26-Jul-1946, 65 y.o.   MRN: [id number]  HPI Dyslipidemia assessment: on Trilipix Prior Advanced Lipid Testing: [facility].   Family history of premature CAD/ MI: M aunt MI pre 21 .  Nutrition: not eating out as much; heart healthy diet .  Exercise: walking 30 min  2-3X/ week . Diabetes : no but fasting hyperglycemia . HTN: not monitored. Smoking history  : never .   Weight :  Down > 10#.  Lab results reviewed :dramatic improvement in TG. Her ALT is minimally elevated at 39.  She denies excess vitamin A, Tylenol, or alcohol intake.    Review of Systems Abd pain, bowel changes- some constipations   Muscle aches- no pain but leg and weakness.  She describes frequent coughing  with meals w/o dyspepsia or frank dysphagia. She has a past history of hives and itching with Axid. She is on an ACE inhibitor and has no angioedema symptoms     Objective:   Physical Exam she appears healthy and well-nourished  She has no scleral icterus or jaundice  The oral pharynx  reveals good dental hygiene with no oral pharyngeal erythema  She has no lymphadenopathy about the neck or axilla Chest is clear with no increased work of breathing, rales, or rhonchi.  Chest regular rhythm with an S4.  All pulses are intact with no deficit  She's alert and oriented x3  Strength is excellent in the lower extremities; deep tendon reflexes are normal        Assessment & Plan:  #1 dyslipidemia; dramatic improvement as noted  #2 hypertension, excellent control  #3 insignificant elevation of a single liver function test  #4 possible dysplasia variant. Avoidance of  dyspepsia triggers will be stressed  . She has a past history of hives with Axid. She is also intolerant to  Nizatidine. I would be reluctant to use ranitidine and we will request a GI evaluation. Plan: To be cautious check fasting ALT and AST in 3-4 months. No change in  medications is indicated.

## 2011-10-07 NOTE — Patient Instructions (Signed)
The triggers for reflux include stress; the "aspirin family" ; alcohol; peppermint; and caffeine (coffee, tea, cola, and chocolate). The aspirin family would include aspirin and the nonsteroidal agents such as ibuprofen &  Naproxen. Tylenol would not cause reflux. Food & drink should be avoided for @ least 2 hours before going to bed.

## 2011-10-16 ENCOUNTER — Other Ambulatory Visit: Payer: Self-pay | Admitting: Internal Medicine

## 2011-11-05 ENCOUNTER — Telehealth: Payer: Self-pay | Admitting: Internal Medicine

## 2011-11-05 NOTE — Telephone Encounter (Signed)
FYI.Marland KitchenMarland KitchenMarland KitchenMarland KitchenMarland KitchenIn reference to GI Referral, patient was scheduled for an appointment with [facility] to see Dr. Juanda Chance and she has cancelled.  Per GI patient called their office and stated that she did not wish to be scheduled at this time, and that an appointment was not supposed to be made, she wanted to hold off on appt.

## 2011-11-24 ENCOUNTER — Ambulatory Visit (INDEPENDENT_AMBULATORY_CARE_PROVIDER_SITE_OTHER): Payer: Medicare Other | Admitting: Internal Medicine

## 2011-11-24 ENCOUNTER — Ambulatory Visit: Payer: Medicare Other | Admitting: Internal Medicine

## 2011-11-24 ENCOUNTER — Encounter: Payer: Self-pay | Admitting: Internal Medicine

## 2011-11-24 DIAGNOSIS — J019 Acute sinusitis, unspecified: Secondary | ICD-10-CM | POA: Diagnosis not present

## 2011-11-24 DIAGNOSIS — R509 Fever, unspecified: Secondary | ICD-10-CM

## 2011-11-24 DIAGNOSIS — J029 Acute pharyngitis, unspecified: Secondary | ICD-10-CM

## 2011-11-24 MED ORDER — AMOXICILLIN 500 MG PO CAPS: 500.0000 mg | ORAL_CAPSULE | Freq: Three times a day (TID) | ORAL | Status: AC

## 2011-11-24 NOTE — Patient Instructions (Addendum)
Plain Mucinex for thick secretions ;force NON dairy fluids . Use a Neti pot daily as needed for sinus congestion Use Eucerin or another moisturizing agent  twice a day  for the drying. Zicam Melts or Zinc lozenges ; vitamin C 2000 mg daily; & Echinacea for 4-7 days. Marland Kitchen

## 2011-11-24 NOTE — Progress Notes (Signed)
  Subjective:    Patient ID: Sharon Bauer, female    DOB: 10-Nov-1946, 66 y.o.   MRN: [id number]  HPI Respiratory tract infection Onset/symptoms:12/31 as malaise & being hot Exposures (illness/environmental/extrinsic):no Progression of symptoms:to headache, rhinitis, Temp to 102 on 1/1 Treatments/response:Tylenol w/o benefit Present symptoms: Fever/chills/sweats:slight fever Frontal headache:yes Facial pain:yes Nasal purulence:yellow , bloody Sore throat:severe Dental pain:no Lymphadenopathy:no Wheezing/shortness of breath:some wheezing Cough/sputum/hemoptysis:scant sputum Associated extrinsic/allergic symptoms:itchy eyes/ sneezing:no Smoking history:never           Review of Systems     Objective:   Physical Exam General appearance:good health ;well nourished; no acute distress or increased work of breathing is present.  No  lymphadenopathy about the head, neck, or axilla noted.   Eyes: No conjunctival inflammation or lid edema is present.  Ears:  External ear exam shows no significant lesions or deformities.  Otoscopic examination reveals clear canals, tympanic membranes are intact bilaterally without bulging, retraction, inflammation or discharge.  Nose:  External nasal examination shows no deformity or inflammation. Nasal mucosa are dry  without lesions or exudates. Septal deviation to L with slight  obstruction to airflow.   Oral exam: Dental hygiene is good; lips and gums are healthy appearing.There is no oropharyngeal erythema or exudate noted.   Heart:  Normal rate and regular rhythm. S1 and S2 normal without gallop,  click, rub or other extra sounds.  Slurring vs Grade 1/2 systolic murmur  Lungs:Chest clear to auscultation; no wheezes, rhonchi,rales ,or rubs present.No increased work of breathing.    Extremities:  No cyanosis  or clubbing  noted    Skin: Warm & dry .          Assessment & Plan:   #1 rhinosinusitis  Plan: See orders and  recommendations

## 2011-12-21 DIAGNOSIS — E782 Mixed hyperlipidemia: Secondary | ICD-10-CM | POA: Diagnosis not present

## 2012-02-04 ENCOUNTER — Ambulatory Visit: Payer: Medicare Other | Admitting: Internal Medicine

## 2012-02-07 DIAGNOSIS — E782 Mixed hyperlipidemia: Secondary | ICD-10-CM | POA: Diagnosis not present

## 2012-03-01 DIAGNOSIS — E782 Mixed hyperlipidemia: Secondary | ICD-10-CM | POA: Diagnosis not present

## 2012-03-01 DIAGNOSIS — R7989 Other specified abnormal findings of blood chemistry: Secondary | ICD-10-CM | POA: Diagnosis not present

## 2012-03-10 DIAGNOSIS — E782 Mixed hyperlipidemia: Secondary | ICD-10-CM | POA: Diagnosis not present

## 2012-03-23 DIAGNOSIS — H5210 Myopia, unspecified eye: Secondary | ICD-10-CM | POA: Diagnosis not present

## 2012-03-23 DIAGNOSIS — H52229 Regular astigmatism, unspecified eye: Secondary | ICD-10-CM | POA: Diagnosis not present

## 2012-03-23 DIAGNOSIS — H524 Presbyopia: Secondary | ICD-10-CM | POA: Diagnosis not present

## 2012-03-23 DIAGNOSIS — H04129 Dry eye syndrome of unspecified lacrimal gland: Secondary | ICD-10-CM | POA: Diagnosis not present

## 2012-05-04 DIAGNOSIS — E782 Mixed hyperlipidemia: Secondary | ICD-10-CM | POA: Diagnosis not present

## 2012-05-29 DIAGNOSIS — E782 Mixed hyperlipidemia: Secondary | ICD-10-CM | POA: Diagnosis not present

## 2012-09-06 DIAGNOSIS — D239 Other benign neoplasm of skin, unspecified: Secondary | ICD-10-CM | POA: Diagnosis not present

## 2012-09-06 DIAGNOSIS — L719 Rosacea, unspecified: Secondary | ICD-10-CM | POA: Diagnosis not present

## 2012-09-06 DIAGNOSIS — L658 Other specified nonscarring hair loss: Secondary | ICD-10-CM | POA: Diagnosis not present

## 2012-09-06 DIAGNOSIS — L821 Other seborrheic keratosis: Secondary | ICD-10-CM | POA: Diagnosis not present

## 2012-09-07 DIAGNOSIS — I10 Essential (primary) hypertension: Secondary | ICD-10-CM | POA: Diagnosis not present

## 2012-09-07 DIAGNOSIS — E559 Vitamin D deficiency, unspecified: Secondary | ICD-10-CM | POA: Diagnosis not present

## 2012-09-07 DIAGNOSIS — R7989 Other specified abnormal findings of blood chemistry: Secondary | ICD-10-CM | POA: Diagnosis not present

## 2012-09-07 DIAGNOSIS — E782 Mixed hyperlipidemia: Secondary | ICD-10-CM | POA: Diagnosis not present

## 2012-09-21 DIAGNOSIS — E782 Mixed hyperlipidemia: Secondary | ICD-10-CM | POA: Diagnosis not present

## 2012-10-02 ENCOUNTER — Encounter: Payer: Self-pay | Admitting: Internal Medicine

## 2012-10-02 ENCOUNTER — Ambulatory Visit (INDEPENDENT_AMBULATORY_CARE_PROVIDER_SITE_OTHER): Payer: Medicare Other | Admitting: Internal Medicine

## 2012-10-02 VITALS — BP 130/82 | HR 60 | Temp 98.3°F | Wt 188.4 lb

## 2012-10-02 DIAGNOSIS — H9202 Otalgia, left ear: Secondary | ICD-10-CM

## 2012-10-02 DIAGNOSIS — H6120 Impacted cerumen, unspecified ear: Secondary | ICD-10-CM | POA: Diagnosis not present

## 2012-10-02 DIAGNOSIS — H9209 Otalgia, unspecified ear: Secondary | ICD-10-CM

## 2012-10-02 NOTE — Patient Instructions (Addendum)
Please do not use Q-tips as we discussed. Should wax build up occur, please put 2-3 drops of mineral oil in the ear at night and cover the canal with a  cotton ball.In the morning fill the canal with hydrogen peroxide & leave  for 10-15 minutes.Following this shower and use the thinnest washrag available to wick out the wax.   Report increasing pain, nasal purulence, or fevers as we discussed

## 2012-10-02 NOTE — Progress Notes (Signed)
Subjective:    Patient ID: Sharon Bauer, female    DOB: 03/31/1946, 66 y.o.   MRN: [id number]  HPI She developed pain in the left ear as of last night w/o trigger; this is been associated with frontal headaches and facial pain. There has been no associated nasal purulence, dental pain, sore throat, fever, chills, sweats, or otic discharge.   Review of Systems    She has had some extrinsic ophthalmologic symptoms for which she takes Zyrtec ; this  preceded the present illness. There's been no associated cough, sputum, wheezing, shortness of breath.                                                                                                                                                                                                                                                                                                                                                                                                                              Objective:   Physical Exam General appearance:good health ;well nourished; no acute distress or increased work of breathing is present.  No  lymphadenopathy about the head, neck, or axilla noted.   Eyes: No conjunctival inflammation or lid edema is present.   Ears:  External ear exam shows no significant lesions or deformities.  Otoscopic examination reveals whitish exudate in the left ear with scattered punctate black hyperpigmentation.  Nose:  External nasal examination shows no deformity or inflammation. Nasal mucosa are pink and moist without  lesions or exudates. No septal dislocation or deviation.No obstruction to airflow.   Oral exam: Dental hygiene is good; lips and gums are healthy appearing.There is no oropharyngeal erythema or exudate noted. Some tenderness over the left temporal mandibular joint with mastication maneuvers   Neck:  No deformities,  masses, or tenderness noted.     Heart:  Normal rate and regular rhythm. S1 and S2  normal without gallop, murmur, click, rub or other extra sounds.   Lungs:Chest clear to auscultation; no wheezes, rhonchi,rales ,or rubs present.No increased work of breathing.    Extremities:  No cyanosis, edema, or clubbing  noted    Skin: Warm & dry   After soaking with hydrogen peroxide; cotton fibers and wax were removed. There was good visualization of the eardrum. Whisper was normal at 6 ft             Assessment & Plan:  #1 ear pain dating to wax impaction pending on the tympanic membrane  Plan: ear hygiene. She is to report fever, nasal purulence, or increasing sinus pain.

## 2012-10-05 ENCOUNTER — Encounter: Payer: Self-pay | Admitting: Internal Medicine

## 2012-10-06 ENCOUNTER — Telehealth: Payer: Self-pay

## 2012-10-06 MED ORDER — NEOMYCIN-POLYMYXIN-HC 3.5-10000-1 OT SOLN: 4.0000 [drp] | Freq: Four times a day (QID) | OTIC | Status: DC

## 2012-10-06 NOTE — Telephone Encounter (Signed)
Rx sent per MD (see mychart message)

## 2012-10-09 ENCOUNTER — Encounter: Payer: Self-pay | Admitting: Internal Medicine

## 2012-10-09 NOTE — Telephone Encounter (Signed)
Rx for Cefuroxime 500 mg twice a day dispense 14

## 2012-10-10 ENCOUNTER — Ambulatory Visit (INDEPENDENT_AMBULATORY_CARE_PROVIDER_SITE_OTHER): Payer: Medicare Other | Admitting: Internal Medicine

## 2012-10-10 ENCOUNTER — Encounter: Payer: Self-pay | Admitting: Internal Medicine

## 2012-10-10 VITALS — BP 128/72 | HR 71 | Temp 97.5°F | Wt 187.8 lb

## 2012-10-10 DIAGNOSIS — H60399 Other infective otitis externa, unspecified ear: Secondary | ICD-10-CM

## 2012-10-10 DIAGNOSIS — H6092 Unspecified otitis externa, left ear: Secondary | ICD-10-CM

## 2012-10-10 MED ORDER — CEFUROXIME AXETIL 500 MG PO TABS: 500.0000 mg | ORAL_TABLET | Freq: Two times a day (BID) | ORAL | Status: DC

## 2012-10-10 MED ORDER — FLUTICASONE PROPIONATE 50 MCG/ACT NA SUSP: NASAL | Status: AC

## 2012-10-10 NOTE — Patient Instructions (Addendum)
Review and correct the record as indicated. Please share record with all medical staff seen.  

## 2012-10-10 NOTE — Progress Notes (Signed)
  Subjective:    Patient ID: Sharon Bauer, female    DOB: July 29, 1946, 66 y.o.   MRN: [id number]  HPI  Ear symptoms persist as muffled hearing &  abnormal sounds described as "a seashell held against  the ear". Additionally she's noted green discharge on the tissue after she has instilled the antibiotic ear drops. She also describes aching discomfort over the LEFT lateral forehead and temple. With nasal lavage; she noted green discharge from the RIGHT nare today only  She denies fever, chills, or sweats.    Review of Systems Her medication list previously included an oral antifungal agent (Griseofulvin); she does not believe that she had been treated for any significant  fungal infections.  Dr.Gould has treated her for "yeast" otitis externa which presented as itching. She was given topical agents. Dr Emily Filbert stated this was not eczema    Objective:   Physical Exam General appearance:good health ;well nourished; no acute distress or increased work of breathing is present.  No  lymphadenopathy about the head, neck, or axilla noted.   Eyes: No conjunctival inflammation or lid edema is present.EOMI ; vision normal with lenses Ears:  External ear exam shows no significant lesions or deformities.  Otoscopic examination reveals GC, white material in the left otic canal. No discoloration is noted. The tympanic membrane is not visualized. There is dramatic hearing loss on the left.  Nose:  External nasal examination shows no deformity or inflammation. Nasal mucosa are pink and moist without lesions or exudates. No septal dislocation or deviation.No obstruction to airflow.   Oral exam: Dental hygiene is good; lips and gums are healthy appearing.There is no oropharyngeal erythema or exudate noted.   Neck:  No deformities, masses, or tenderness noted.   Supple with full range of motion without pain.   Heart:  Normal rate and regular rhythm. S1 and S2 normal without gallop, murmur, click, rub or other  extra sounds.   Lungs:Chest clear to auscultation; no wheezes, rhonchi,rales ,or rubs present.No increased work of breathing.    Extremities:  No cyanosis, edema, or clubbing  noted    Skin: Warm & dry           Assessment & Plan:  #1 otitis externa, unresolved with antibiotic drops.  R/O associated rhinosinusitis.Associated hearing loss and eustachian tube dysfunction. The listing of griseofulvin on her prior med list appears to be an error. When the ear was initially examined; there was a greenish pigment to the exudate in the canal.  Plan: Cefuroxime will be initiated pending ENT consultation.

## 2012-10-12 DIAGNOSIS — E782 Mixed hyperlipidemia: Secondary | ICD-10-CM | POA: Diagnosis not present

## 2012-10-13 DIAGNOSIS — H60399 Other infective otitis externa, unspecified ear: Secondary | ICD-10-CM | POA: Diagnosis not present

## 2012-10-13 DIAGNOSIS — J310 Chronic rhinitis: Secondary | ICD-10-CM | POA: Diagnosis not present

## 2012-10-13 DIAGNOSIS — H6120 Impacted cerumen, unspecified ear: Secondary | ICD-10-CM | POA: Diagnosis not present

## 2012-10-13 DIAGNOSIS — J342 Deviated nasal septum: Secondary | ICD-10-CM | POA: Diagnosis not present

## 2012-10-18 DIAGNOSIS — E782 Mixed hyperlipidemia: Secondary | ICD-10-CM | POA: Diagnosis not present

## 2012-11-13 DIAGNOSIS — H60399 Other infective otitis externa, unspecified ear: Secondary | ICD-10-CM | POA: Diagnosis not present

## 2012-11-13 DIAGNOSIS — R42 Dizziness and giddiness: Secondary | ICD-10-CM | POA: Diagnosis not present

## 2012-11-13 DIAGNOSIS — J3489 Other specified disorders of nose and nasal sinuses: Secondary | ICD-10-CM | POA: Diagnosis not present

## 2012-11-13 DIAGNOSIS — J342 Deviated nasal septum: Secondary | ICD-10-CM | POA: Diagnosis not present

## 2012-11-22 HISTORY — PX: CATARACT EXTRACTION, BILATERAL: SHX1313

## 2012-11-24 DIAGNOSIS — H93299 Other abnormal auditory perceptions, unspecified ear: Secondary | ICD-10-CM | POA: Diagnosis not present

## 2012-11-24 DIAGNOSIS — R42 Dizziness and giddiness: Secondary | ICD-10-CM | POA: Diagnosis not present

## 2013-01-06 ENCOUNTER — Other Ambulatory Visit: Payer: Self-pay

## 2013-02-01 DIAGNOSIS — R5383 Other fatigue: Secondary | ICD-10-CM | POA: Diagnosis not present

## 2013-02-01 DIAGNOSIS — R5381 Other malaise: Secondary | ICD-10-CM | POA: Diagnosis not present

## 2013-02-01 DIAGNOSIS — E782 Mixed hyperlipidemia: Secondary | ICD-10-CM | POA: Diagnosis not present

## 2013-02-01 DIAGNOSIS — R7989 Other specified abnormal findings of blood chemistry: Secondary | ICD-10-CM | POA: Diagnosis not present

## 2013-02-01 DIAGNOSIS — E559 Vitamin D deficiency, unspecified: Secondary | ICD-10-CM | POA: Diagnosis not present

## 2013-02-01 DIAGNOSIS — D509 Iron deficiency anemia, unspecified: Secondary | ICD-10-CM | POA: Diagnosis not present

## 2013-02-15 DIAGNOSIS — E782 Mixed hyperlipidemia: Secondary | ICD-10-CM | POA: Diagnosis not present

## 2013-02-21 DIAGNOSIS — J329 Chronic sinusitis, unspecified: Secondary | ICD-10-CM | POA: Diagnosis not present

## 2013-03-26 DIAGNOSIS — H04129 Dry eye syndrome of unspecified lacrimal gland: Secondary | ICD-10-CM | POA: Diagnosis not present

## 2013-04-30 DIAGNOSIS — J342 Deviated nasal septum: Secondary | ICD-10-CM | POA: Diagnosis not present

## 2013-04-30 DIAGNOSIS — H60509 Unspecified acute noninfective otitis externa, unspecified ear: Secondary | ICD-10-CM | POA: Diagnosis not present

## 2013-05-15 DIAGNOSIS — J342 Deviated nasal septum: Secondary | ICD-10-CM | POA: Diagnosis not present

## 2013-05-15 DIAGNOSIS — J3489 Other specified disorders of nose and nasal sinuses: Secondary | ICD-10-CM | POA: Diagnosis not present

## 2013-05-15 DIAGNOSIS — H6120 Impacted cerumen, unspecified ear: Secondary | ICD-10-CM | POA: Diagnosis not present

## 2013-05-15 DIAGNOSIS — H60509 Unspecified acute noninfective otitis externa, unspecified ear: Secondary | ICD-10-CM | POA: Diagnosis not present

## 2013-06-05 DIAGNOSIS — H524 Presbyopia: Secondary | ICD-10-CM | POA: Diagnosis not present

## 2013-06-05 DIAGNOSIS — H2510 Age-related nuclear cataract, unspecified eye: Secondary | ICD-10-CM | POA: Diagnosis not present

## 2013-06-05 DIAGNOSIS — H5210 Myopia, unspecified eye: Secondary | ICD-10-CM | POA: Diagnosis not present

## 2013-06-05 DIAGNOSIS — H25019 Cortical age-related cataract, unspecified eye: Secondary | ICD-10-CM | POA: Diagnosis not present

## 2013-06-05 DIAGNOSIS — H25049 Posterior subcapsular polar age-related cataract, unspecified eye: Secondary | ICD-10-CM | POA: Diagnosis not present

## 2013-06-07 ENCOUNTER — Ambulatory Visit: Payer: Self-pay | Admitting: Anesthesiology

## 2013-06-07 DIAGNOSIS — I10 Essential (primary) hypertension: Secondary | ICD-10-CM

## 2013-06-07 DIAGNOSIS — Z01810 Encounter for preprocedural cardiovascular examination: Secondary | ICD-10-CM | POA: Diagnosis not present

## 2013-06-08 ENCOUNTER — Ambulatory Visit: Payer: Self-pay | Admitting: Unknown Physician Specialty

## 2013-06-08 DIAGNOSIS — K219 Gastro-esophageal reflux disease without esophagitis: Secondary | ICD-10-CM | POA: Diagnosis not present

## 2013-06-08 DIAGNOSIS — J343 Hypertrophy of nasal turbinates: Secondary | ICD-10-CM | POA: Diagnosis not present

## 2013-06-08 DIAGNOSIS — Z7982 Long term (current) use of aspirin: Secondary | ICD-10-CM | POA: Diagnosis not present

## 2013-06-08 DIAGNOSIS — J3489 Other specified disorders of nose and nasal sinuses: Secondary | ICD-10-CM | POA: Diagnosis not present

## 2013-06-08 DIAGNOSIS — J342 Deviated nasal septum: Secondary | ICD-10-CM | POA: Diagnosis not present

## 2013-06-08 DIAGNOSIS — Z888 Allergy status to other drugs, medicaments and biological substances status: Secondary | ICD-10-CM | POA: Diagnosis not present

## 2013-06-08 DIAGNOSIS — I10 Essential (primary) hypertension: Secondary | ICD-10-CM | POA: Diagnosis not present

## 2013-06-27 ENCOUNTER — Other Ambulatory Visit: Payer: Self-pay

## 2013-07-12 DIAGNOSIS — R7989 Other specified abnormal findings of blood chemistry: Secondary | ICD-10-CM | POA: Diagnosis not present

## 2013-07-12 DIAGNOSIS — D509 Iron deficiency anemia, unspecified: Secondary | ICD-10-CM | POA: Diagnosis not present

## 2013-07-12 DIAGNOSIS — R5381 Other malaise: Secondary | ICD-10-CM | POA: Diagnosis not present

## 2013-07-12 DIAGNOSIS — E782 Mixed hyperlipidemia: Secondary | ICD-10-CM | POA: Diagnosis not present

## 2013-07-12 DIAGNOSIS — E7210 Disorders of sulfur-bearing amino-acid metabolism, unspecified: Secondary | ICD-10-CM | POA: Diagnosis not present

## 2013-07-12 DIAGNOSIS — E559 Vitamin D deficiency, unspecified: Secondary | ICD-10-CM | POA: Diagnosis not present

## 2013-07-24 DIAGNOSIS — Z124 Encounter for screening for malignant neoplasm of cervix: Secondary | ICD-10-CM | POA: Diagnosis not present

## 2013-07-24 DIAGNOSIS — Z01419 Encounter for gynecological examination (general) (routine) without abnormal findings: Secondary | ICD-10-CM | POA: Diagnosis not present

## 2013-08-02 DIAGNOSIS — E782 Mixed hyperlipidemia: Secondary | ICD-10-CM | POA: Diagnosis not present

## 2013-08-03 DIAGNOSIS — H2510 Age-related nuclear cataract, unspecified eye: Secondary | ICD-10-CM | POA: Diagnosis not present

## 2013-08-03 DIAGNOSIS — H269 Unspecified cataract: Secondary | ICD-10-CM | POA: Diagnosis not present

## 2013-08-20 DIAGNOSIS — H2510 Age-related nuclear cataract, unspecified eye: Secondary | ICD-10-CM | POA: Diagnosis not present

## 2013-08-20 DIAGNOSIS — H269 Unspecified cataract: Secondary | ICD-10-CM | POA: Diagnosis not present

## 2013-09-06 DIAGNOSIS — D239 Other benign neoplasm of skin, unspecified: Secondary | ICD-10-CM | POA: Diagnosis not present

## 2013-09-06 DIAGNOSIS — I781 Nevus, non-neoplastic: Secondary | ICD-10-CM | POA: Diagnosis not present

## 2013-09-06 DIAGNOSIS — L820 Inflamed seborrheic keratosis: Secondary | ICD-10-CM | POA: Diagnosis not present

## 2013-09-06 DIAGNOSIS — D485 Neoplasm of uncertain behavior of skin: Secondary | ICD-10-CM | POA: Diagnosis not present

## 2013-09-06 DIAGNOSIS — L821 Other seborrheic keratosis: Secondary | ICD-10-CM | POA: Diagnosis not present

## 2013-09-06 DIAGNOSIS — B079 Viral wart, unspecified: Secondary | ICD-10-CM | POA: Diagnosis not present

## 2013-09-06 DIAGNOSIS — L570 Actinic keratosis: Secondary | ICD-10-CM | POA: Diagnosis not present

## 2013-09-06 DIAGNOSIS — L719 Rosacea, unspecified: Secondary | ICD-10-CM | POA: Diagnosis not present

## 2013-09-22 DIAGNOSIS — R21 Rash and other nonspecific skin eruption: Secondary | ICD-10-CM | POA: Diagnosis not present

## 2013-09-27 ENCOUNTER — Other Ambulatory Visit: Payer: Self-pay

## 2013-10-24 DIAGNOSIS — E782 Mixed hyperlipidemia: Secondary | ICD-10-CM | POA: Diagnosis not present

## 2013-10-25 DIAGNOSIS — E559 Vitamin D deficiency, unspecified: Secondary | ICD-10-CM | POA: Diagnosis not present

## 2013-10-25 DIAGNOSIS — R7989 Other specified abnormal findings of blood chemistry: Secondary | ICD-10-CM | POA: Diagnosis not present

## 2013-10-25 DIAGNOSIS — E782 Mixed hyperlipidemia: Secondary | ICD-10-CM | POA: Diagnosis not present

## 2013-10-25 DIAGNOSIS — D509 Iron deficiency anemia, unspecified: Secondary | ICD-10-CM | POA: Diagnosis not present

## 2013-10-25 DIAGNOSIS — E7800 Pure hypercholesterolemia, unspecified: Secondary | ICD-10-CM | POA: Diagnosis not present

## 2013-11-09 DIAGNOSIS — E782 Mixed hyperlipidemia: Secondary | ICD-10-CM | POA: Diagnosis not present

## 2014-02-04 DIAGNOSIS — H6120 Impacted cerumen, unspecified ear: Secondary | ICD-10-CM | POA: Diagnosis not present

## 2014-02-04 DIAGNOSIS — H60509 Unspecified acute noninfective otitis externa, unspecified ear: Secondary | ICD-10-CM | POA: Diagnosis not present

## 2014-02-14 DIAGNOSIS — H04129 Dry eye syndrome of unspecified lacrimal gland: Secondary | ICD-10-CM | POA: Diagnosis not present

## 2014-02-14 DIAGNOSIS — M23329 Other meniscus derangements, posterior horn of medial meniscus, unspecified knee: Secondary | ICD-10-CM | POA: Diagnosis not present

## 2014-02-14 DIAGNOSIS — H52229 Regular astigmatism, unspecified eye: Secondary | ICD-10-CM | POA: Diagnosis not present

## 2014-02-14 DIAGNOSIS — H5210 Myopia, unspecified eye: Secondary | ICD-10-CM | POA: Diagnosis not present

## 2014-02-18 DIAGNOSIS — D230 Other benign neoplasm of skin of lip: Secondary | ICD-10-CM | POA: Diagnosis not present

## 2014-02-19 ENCOUNTER — Other Ambulatory Visit: Payer: Self-pay

## 2014-02-19 DIAGNOSIS — Z1231 Encounter for screening mammogram for malignant neoplasm of breast: Secondary | ICD-10-CM

## 2014-02-21 DIAGNOSIS — M1710 Unilateral primary osteoarthritis, unspecified knee: Secondary | ICD-10-CM | POA: Diagnosis not present

## 2014-02-28 DIAGNOSIS — M23329 Other meniscus derangements, posterior horn of medial meniscus, unspecified knee: Secondary | ICD-10-CM | POA: Diagnosis not present

## 2014-03-05 ENCOUNTER — Ambulatory Visit: Payer: Medicare Other

## 2014-03-11 DIAGNOSIS — H1130 Conjunctival hemorrhage, unspecified eye: Secondary | ICD-10-CM | POA: Diagnosis not present

## 2014-03-19 DIAGNOSIS — K135 Oral submucous fibrosis: Secondary | ICD-10-CM | POA: Diagnosis not present

## 2014-03-19 DIAGNOSIS — K1370 Unspecified lesions of oral mucosa: Secondary | ICD-10-CM | POA: Diagnosis not present

## 2014-04-01 DIAGNOSIS — E559 Vitamin D deficiency, unspecified: Secondary | ICD-10-CM | POA: Diagnosis not present

## 2014-04-01 DIAGNOSIS — E7800 Pure hypercholesterolemia, unspecified: Secondary | ICD-10-CM | POA: Diagnosis not present

## 2014-04-01 DIAGNOSIS — D509 Iron deficiency anemia, unspecified: Secondary | ICD-10-CM | POA: Diagnosis not present

## 2014-04-01 DIAGNOSIS — R7989 Other specified abnormal findings of blood chemistry: Secondary | ICD-10-CM | POA: Diagnosis not present

## 2014-04-01 DIAGNOSIS — E782 Mixed hyperlipidemia: Secondary | ICD-10-CM | POA: Diagnosis not present

## 2014-04-29 DIAGNOSIS — R42 Dizziness and giddiness: Secondary | ICD-10-CM | POA: Diagnosis not present

## 2014-04-29 DIAGNOSIS — I10 Essential (primary) hypertension: Secondary | ICD-10-CM | POA: Diagnosis not present

## 2014-04-29 DIAGNOSIS — E782 Mixed hyperlipidemia: Secondary | ICD-10-CM | POA: Diagnosis not present

## 2014-04-29 DIAGNOSIS — R7309 Other abnormal glucose: Secondary | ICD-10-CM | POA: Diagnosis not present

## 2014-05-02 DIAGNOSIS — E782 Mixed hyperlipidemia: Secondary | ICD-10-CM | POA: Diagnosis not present

## 2014-05-16 ENCOUNTER — Ambulatory Visit
Admission: RE | Admit: 2014-05-16 | Discharge: 2014-05-16 | Disposition: A | Discharge status: Home / Self Care | Payer: Medicare Other | Source: Ambulatory Visit

## 2014-05-16 DIAGNOSIS — Z1231 Encounter for screening mammogram for malignant neoplasm of breast: Secondary | ICD-10-CM | POA: Diagnosis not present

## 2014-07-19 DIAGNOSIS — L821 Other seborrheic keratosis: Secondary | ICD-10-CM | POA: Diagnosis not present

## 2014-07-19 DIAGNOSIS — L918 Other hypertrophic disorders of the skin: Secondary | ICD-10-CM | POA: Diagnosis not present

## 2014-07-19 DIAGNOSIS — L908 Other atrophic disorders of skin: Secondary | ICD-10-CM | POA: Diagnosis not present

## 2014-07-19 DIAGNOSIS — L820 Inflamed seborrheic keratosis: Secondary | ICD-10-CM | POA: Diagnosis not present

## 2014-07-19 DIAGNOSIS — L719 Rosacea, unspecified: Secondary | ICD-10-CM | POA: Diagnosis not present

## 2014-07-22 DIAGNOSIS — E782 Mixed hyperlipidemia: Secondary | ICD-10-CM | POA: Diagnosis not present

## 2014-07-22 DIAGNOSIS — D509 Iron deficiency anemia, unspecified: Secondary | ICD-10-CM | POA: Diagnosis not present

## 2014-07-22 DIAGNOSIS — R7989 Other specified abnormal findings of blood chemistry: Secondary | ICD-10-CM | POA: Diagnosis not present

## 2014-07-22 DIAGNOSIS — E559 Vitamin D deficiency, unspecified: Secondary | ICD-10-CM | POA: Diagnosis not present

## 2014-08-06 DIAGNOSIS — E782 Mixed hyperlipidemia: Secondary | ICD-10-CM | POA: Diagnosis not present

## 2014-08-14 DIAGNOSIS — D235 Other benign neoplasm of skin of trunk: Secondary | ICD-10-CM | POA: Diagnosis not present

## 2014-08-14 DIAGNOSIS — L820 Inflamed seborrheic keratosis: Secondary | ICD-10-CM | POA: Diagnosis not present

## 2014-08-14 DIAGNOSIS — L570 Actinic keratosis: Secondary | ICD-10-CM | POA: Diagnosis not present

## 2014-08-26 DIAGNOSIS — H6590 Unspecified nonsuppurative otitis media, unspecified ear: Secondary | ICD-10-CM | POA: Diagnosis not present

## 2014-08-26 DIAGNOSIS — Z0000 Encounter for general adult medical examination without abnormal findings: Secondary | ICD-10-CM | POA: Diagnosis not present

## 2014-08-26 DIAGNOSIS — F900 Attention-deficit hyperactivity disorder, predominantly inattentive type: Secondary | ICD-10-CM | POA: Diagnosis not present

## 2014-08-26 DIAGNOSIS — R739 Hyperglycemia, unspecified: Secondary | ICD-10-CM | POA: Diagnosis not present

## 2014-08-26 DIAGNOSIS — M8580 Other specified disorders of bone density and structure, unspecified site: Secondary | ICD-10-CM | POA: Diagnosis not present

## 2014-08-26 DIAGNOSIS — E782 Mixed hyperlipidemia: Secondary | ICD-10-CM | POA: Diagnosis not present

## 2014-08-26 DIAGNOSIS — I10 Essential (primary) hypertension: Secondary | ICD-10-CM | POA: Diagnosis not present

## 2014-08-26 DIAGNOSIS — Z1389 Encounter for screening for other disorder: Secondary | ICD-10-CM | POA: Diagnosis not present

## 2014-09-23 ENCOUNTER — Encounter: Payer: Self-pay | Admitting: Internal Medicine

## 2014-09-24 DIAGNOSIS — E782 Mixed hyperlipidemia: Secondary | ICD-10-CM | POA: Diagnosis not present

## 2014-10-31 DIAGNOSIS — M62830 Muscle spasm of back: Secondary | ICD-10-CM | POA: Diagnosis not present

## 2014-10-31 DIAGNOSIS — M9903 Segmental and somatic dysfunction of lumbar region: Secondary | ICD-10-CM | POA: Diagnosis not present

## 2014-10-31 DIAGNOSIS — M542 Cervicalgia: Secondary | ICD-10-CM | POA: Diagnosis not present

## 2014-10-31 DIAGNOSIS — M9901 Segmental and somatic dysfunction of cervical region: Secondary | ICD-10-CM | POA: Diagnosis not present

## 2014-10-31 DIAGNOSIS — M545 Low back pain: Secondary | ICD-10-CM | POA: Diagnosis not present

## 2014-10-31 DIAGNOSIS — M9902 Segmental and somatic dysfunction of thoracic region: Secondary | ICD-10-CM | POA: Diagnosis not present

## 2014-11-04 DIAGNOSIS — M9903 Segmental and somatic dysfunction of lumbar region: Secondary | ICD-10-CM | POA: Diagnosis not present

## 2014-11-04 DIAGNOSIS — M9902 Segmental and somatic dysfunction of thoracic region: Secondary | ICD-10-CM | POA: Diagnosis not present

## 2014-11-04 DIAGNOSIS — M9901 Segmental and somatic dysfunction of cervical region: Secondary | ICD-10-CM | POA: Diagnosis not present

## 2014-11-04 DIAGNOSIS — M542 Cervicalgia: Secondary | ICD-10-CM | POA: Diagnosis not present

## 2014-11-04 DIAGNOSIS — M545 Low back pain: Secondary | ICD-10-CM | POA: Diagnosis not present

## 2014-11-04 DIAGNOSIS — M62830 Muscle spasm of back: Secondary | ICD-10-CM | POA: Diagnosis not present

## 2014-11-05 DIAGNOSIS — M62830 Muscle spasm of back: Secondary | ICD-10-CM | POA: Diagnosis not present

## 2014-11-05 DIAGNOSIS — M9901 Segmental and somatic dysfunction of cervical region: Secondary | ICD-10-CM | POA: Diagnosis not present

## 2014-11-05 DIAGNOSIS — M542 Cervicalgia: Secondary | ICD-10-CM | POA: Diagnosis not present

## 2014-11-05 DIAGNOSIS — M9903 Segmental and somatic dysfunction of lumbar region: Secondary | ICD-10-CM | POA: Diagnosis not present

## 2014-11-05 DIAGNOSIS — M545 Low back pain: Secondary | ICD-10-CM | POA: Diagnosis not present

## 2014-11-05 DIAGNOSIS — M9902 Segmental and somatic dysfunction of thoracic region: Secondary | ICD-10-CM | POA: Diagnosis not present

## 2014-11-07 DIAGNOSIS — M9903 Segmental and somatic dysfunction of lumbar region: Secondary | ICD-10-CM | POA: Diagnosis not present

## 2014-11-07 DIAGNOSIS — M9901 Segmental and somatic dysfunction of cervical region: Secondary | ICD-10-CM | POA: Diagnosis not present

## 2014-11-07 DIAGNOSIS — M542 Cervicalgia: Secondary | ICD-10-CM | POA: Diagnosis not present

## 2014-11-07 DIAGNOSIS — M62830 Muscle spasm of back: Secondary | ICD-10-CM | POA: Diagnosis not present

## 2014-11-07 DIAGNOSIS — M9902 Segmental and somatic dysfunction of thoracic region: Secondary | ICD-10-CM | POA: Diagnosis not present

## 2014-11-07 DIAGNOSIS — M545 Low back pain: Secondary | ICD-10-CM | POA: Diagnosis not present

## 2014-11-12 DIAGNOSIS — M545 Low back pain: Secondary | ICD-10-CM | POA: Diagnosis not present

## 2014-11-12 DIAGNOSIS — M62830 Muscle spasm of back: Secondary | ICD-10-CM | POA: Diagnosis not present

## 2014-11-12 DIAGNOSIS — M9901 Segmental and somatic dysfunction of cervical region: Secondary | ICD-10-CM | POA: Diagnosis not present

## 2014-11-12 DIAGNOSIS — M542 Cervicalgia: Secondary | ICD-10-CM | POA: Diagnosis not present

## 2014-11-12 DIAGNOSIS — M9902 Segmental and somatic dysfunction of thoracic region: Secondary | ICD-10-CM | POA: Diagnosis not present

## 2014-11-12 DIAGNOSIS — M9903 Segmental and somatic dysfunction of lumbar region: Secondary | ICD-10-CM | POA: Diagnosis not present

## 2014-11-13 DIAGNOSIS — M545 Low back pain: Secondary | ICD-10-CM | POA: Diagnosis not present

## 2014-11-13 DIAGNOSIS — M542 Cervicalgia: Secondary | ICD-10-CM | POA: Diagnosis not present

## 2014-11-13 DIAGNOSIS — M9902 Segmental and somatic dysfunction of thoracic region: Secondary | ICD-10-CM | POA: Diagnosis not present

## 2014-11-13 DIAGNOSIS — M62830 Muscle spasm of back: Secondary | ICD-10-CM | POA: Diagnosis not present

## 2014-11-13 DIAGNOSIS — M9901 Segmental and somatic dysfunction of cervical region: Secondary | ICD-10-CM | POA: Diagnosis not present

## 2014-11-13 DIAGNOSIS — M9903 Segmental and somatic dysfunction of lumbar region: Secondary | ICD-10-CM | POA: Diagnosis not present

## 2014-11-18 DIAGNOSIS — M545 Low back pain: Secondary | ICD-10-CM | POA: Diagnosis not present

## 2014-11-18 DIAGNOSIS — M9902 Segmental and somatic dysfunction of thoracic region: Secondary | ICD-10-CM | POA: Diagnosis not present

## 2014-11-18 DIAGNOSIS — M542 Cervicalgia: Secondary | ICD-10-CM | POA: Diagnosis not present

## 2014-11-18 DIAGNOSIS — M9903 Segmental and somatic dysfunction of lumbar region: Secondary | ICD-10-CM | POA: Diagnosis not present

## 2014-11-18 DIAGNOSIS — M62830 Muscle spasm of back: Secondary | ICD-10-CM | POA: Diagnosis not present

## 2014-11-18 DIAGNOSIS — M9901 Segmental and somatic dysfunction of cervical region: Secondary | ICD-10-CM | POA: Diagnosis not present

## 2014-12-11 DIAGNOSIS — M545 Low back pain: Secondary | ICD-10-CM | POA: Diagnosis not present

## 2014-12-11 DIAGNOSIS — M542 Cervicalgia: Secondary | ICD-10-CM | POA: Diagnosis not present

## 2014-12-11 DIAGNOSIS — M9901 Segmental and somatic dysfunction of cervical region: Secondary | ICD-10-CM | POA: Diagnosis not present

## 2014-12-11 DIAGNOSIS — M62830 Muscle spasm of back: Secondary | ICD-10-CM | POA: Diagnosis not present

## 2014-12-11 DIAGNOSIS — M9902 Segmental and somatic dysfunction of thoracic region: Secondary | ICD-10-CM | POA: Diagnosis not present

## 2014-12-11 DIAGNOSIS — M9903 Segmental and somatic dysfunction of lumbar region: Secondary | ICD-10-CM | POA: Diagnosis not present

## 2014-12-14 DIAGNOSIS — Z23 Encounter for immunization: Secondary | ICD-10-CM | POA: Diagnosis not present

## 2015-01-30 DIAGNOSIS — R739 Hyperglycemia, unspecified: Secondary | ICD-10-CM | POA: Diagnosis not present

## 2015-01-30 DIAGNOSIS — I10 Essential (primary) hypertension: Secondary | ICD-10-CM | POA: Diagnosis not present

## 2015-01-30 DIAGNOSIS — M791 Myalgia: Secondary | ICD-10-CM | POA: Diagnosis not present

## 2015-01-30 DIAGNOSIS — E782 Mixed hyperlipidemia: Secondary | ICD-10-CM | POA: Diagnosis not present

## 2015-01-30 DIAGNOSIS — J329 Chronic sinusitis, unspecified: Secondary | ICD-10-CM | POA: Diagnosis not present

## 2015-01-30 DIAGNOSIS — Z79899 Other long term (current) drug therapy: Secondary | ICD-10-CM | POA: Diagnosis not present

## 2015-02-11 DIAGNOSIS — M8580 Other specified disorders of bone density and structure, unspecified site: Secondary | ICD-10-CM | POA: Diagnosis not present

## 2015-02-11 DIAGNOSIS — M859 Disorder of bone density and structure, unspecified: Secondary | ICD-10-CM | POA: Diagnosis not present

## 2015-02-25 DIAGNOSIS — J302 Other seasonal allergic rhinitis: Secondary | ICD-10-CM | POA: Diagnosis not present

## 2015-02-25 DIAGNOSIS — K219 Gastro-esophageal reflux disease without esophagitis: Secondary | ICD-10-CM | POA: Diagnosis not present

## 2015-03-27 DIAGNOSIS — H1045 Other chronic allergic conjunctivitis: Secondary | ICD-10-CM | POA: Diagnosis not present

## 2015-03-27 DIAGNOSIS — H52223 Regular astigmatism, bilateral: Secondary | ICD-10-CM | POA: Diagnosis not present

## 2015-03-27 DIAGNOSIS — H5202 Hypermetropia, left eye: Secondary | ICD-10-CM | POA: Diagnosis not present

## 2015-04-01 DIAGNOSIS — R350 Frequency of micturition: Secondary | ICD-10-CM | POA: Diagnosis not present

## 2015-06-04 DIAGNOSIS — L820 Inflamed seborrheic keratosis: Secondary | ICD-10-CM | POA: Diagnosis not present

## 2015-06-04 DIAGNOSIS — L309 Dermatitis, unspecified: Secondary | ICD-10-CM | POA: Diagnosis not present

## 2015-06-06 ENCOUNTER — Telehealth: Payer: Self-pay | Admitting: Internal Medicine

## 2015-06-06 NOTE — Telephone Encounter (Signed)
[facility] [place] [facility]  Patient Name: Sharon Bauer  DOB: [birth year]    Initial Comment Caller states she is extremely dizzy. She feels like she is going to pass out.    Nurse Assessment  Nurse: Justine Null, RN, Rodena Piety Date/Time (Eastern Time): 06/06/2015 3:55:55 PM  Confirm and document reason for call. If symptomatic, describe symptoms. ---Caller states she is extremely dizzy. She feels like she is going to pass out. caller stated that she has been having this since Tuesday and has been having no vomiting or diarrhea and has had no fevers and has a headache that comes and goes and has been having headache yesterday and has no headache at present caller stated that she has been able to eat and drink fairly well  Has the patient traveled out of the country within the last 30 days? ---No  Does the patient require triage? ---Yes  Related visit to physician within the last 2 weeks? ---Yes  Does the PT have any chronic conditions? (i.e. diabetes, asthma, etc.) ---Yes  List chronic conditions. ---HTN heart disease high cholesterol     Guidelines    Guideline Title Affirmed Question Affirmed Notes  Dizziness - Lightheadedness [1] MODERATE dizziness (e.g., interferes with normal activities) AND [2] has NOT been evaluated by physician for this (Exception: dizziness caused by heat exposure, sudden standing, or poor fluid intake)    Final Disposition User   See Physician within [place], RN, [facility]    Referrals  [facility]   Disagree/Comply: Comply

## 2015-06-07 ENCOUNTER — Encounter: Payer: Self-pay | Admitting: Family Medicine

## 2015-06-07 ENCOUNTER — Ambulatory Visit (INDEPENDENT_AMBULATORY_CARE_PROVIDER_SITE_OTHER): Payer: Medicare Other | Admitting: Family Medicine

## 2015-06-07 VITALS — BP 130/80 | HR 73 | Temp 98.5°F | Ht 65.0 in | Wt 181.0 lb

## 2015-06-07 DIAGNOSIS — R42 Dizziness and giddiness: Secondary | ICD-10-CM | POA: Diagnosis not present

## 2015-06-07 NOTE — Assessment & Plan Note (Signed)
No signs of any infectious etiology at this time. Patient does have an air-fluid level behind the tympanic membranes bilaterally that are likely contributing to the difficulty and dizziness. Patient given a trial of cortical steroid-induced spray that I think will be beneficial. We discussed changing patient's in a histamine as well and the possibility of Singulair if necessary. Patient will discuss with primary care provider. There is no signs of CVA, migraine, pneumonia, or any other etiology at this time. I do not feel that further workup is necessary. Patient is following up with primary care provider next week.

## 2015-06-07 NOTE — Progress Notes (Signed)
[facility] [address], [place] [address] Phone: [phone] Subjective:      CC: Dizziness  HCW:[id number] Sharon Bauer is a 69 y.o. female coming in with complaint of dizziness. Patient has had this intermittently for multiple years and states that this season seems to be the worst. Patient has recently changed her and a histamine in the last 3 weeks and actually states that it seems to be worse. Patient states that when she is walking she feels like she is potentially poling to 1 size any other. Patient states that sometimes she can feel fatigued as well. Denies any headaches, any radiation on the side of the face of the neck. Denies any chest pain. Denies any shortness of breath. Patient states that she feels off balance. Maybe some difficulty with hearing. As long as she is sitting though she seems to be doing relatively well. Patient states that when she rolls over in bed though he can even give her a spinning feeling.  Delma Post Past Surgical History  Procedure Laterality Date  . Cesarean section    . Abdominal hysterectomy  1982    due to fibroids  . Tonsillectomy  1960  . Abdominoplasty  1994  . Colonoscopy     History  Substance Use Topics  . Smoking status: Never Smoker   . Smokeless tobacco: Never Used  . Alcohol Use: No     Comment: rare   Allergies  Allergen Reactions  . Nizatidine Hives and Itching   Family History  Problem Relation Age of Onset  . Heart attack Maternal Uncle 55  . Heart disease Maternal Uncle 62    MI  . Heart attack Maternal Aunt 60  . Mental illness Maternal Aunt     [place]  . Heart attack Maternal Uncle 65  . Heart disease Maternal Uncle 73    MI  . Diabetes Maternal Grandfather   . Asthma Paternal Aunt   . Asthma Paternal Aunt   . Alzheimer's disease Maternal Aunt   . Heart disease Maternal Aunt 60    MI  . Heart attack Father     59s  . Heart failure Father   . COPD Father   .  Hypertension Father   . Emphysema Father   . Heart disease Father 39    MI, CHF,  . Hypertension Mother   . Hyperlipidemia Mother   . Osteoporosis Mother   . Osteoarthritis Mother   . Hyperlipidemia Sister   . Colon cancer Paternal Grandmother   . Cancer Paternal Grandfather     COLON             Past medical history, social, surgical and family history all reviewed in electronic medical record.   Review of Systems: No headache, visual changes, nausea, vomiting, diarrhea, constipation, dizziness, abdominal pain, skin rash, fevers, chills, night sweats, weight loss, swollen lymph nodes, body aches, joint swelling, muscle aches, chest pain, shortness of breath, mood changes.   Objective Blood pressure 130/80, pulse 73, temperature 98.5 F (36.9 C), temperature source Oral, height 5\' 5"  (1.651 m), weight 181 lb (82.101 kg), SpO2 96 %.  General: No apparent distress alert and oriented x3 mood and affect normal, dressed appropriately.  HEENT: Pupils equal, extraocular movements intact  no nystagmus noted. Tympanic membranes noted bilaterally to have an air-fluid With mild bulging but no erythema.  Respiratory: Patient's speak in full sentences and does not appear short of breath  Cardiovascular: No lower extremity  edema, non tender, no erythema,  regular rate and rhythm with no murmur appreciated and no carotid bruit  Skin: Warm dry intact with no signs of infection or rash on extremities or on axial skeleton.  Abdomen: Soft nontender  Neuro: Cranial nerves II through XII are intact, neurovascularly intact in all extremities with 2+ DTRs and 2+ pulses. No signs of weakness Lymph: No lymphadenopathy of posterior or anterior cervical chain or axillae bilaterally.  Gait normal with good balance and coordination.  MSK:  Non tender with full range of motion and good stability and symmetric strength and tone of shoulders, elbows, wrist, hip, knee and ankles bilaterally.     Impression  and Recommendations:     This case required medical decision making of moderate complexity.

## 2015-06-07 NOTE — Patient Instructions (Signed)
Good to see you.  Try nose spray 1 spray each nostril for next 2 weeks.  Change to Claritin Monitor symptoms.  If worsening or feel weakness on one side of body seek medical attention Stay hydrated.  Follow up with [facility] as scheduled.

## 2015-06-10 ENCOUNTER — Ambulatory Visit: Payer: Medicare Other | Admitting: Internal Medicine

## 2015-06-11 ENCOUNTER — Ambulatory Visit (INDEPENDENT_AMBULATORY_CARE_PROVIDER_SITE_OTHER): Payer: Medicare Other | Admitting: Internal Medicine

## 2015-06-11 ENCOUNTER — Other Ambulatory Visit (INDEPENDENT_AMBULATORY_CARE_PROVIDER_SITE_OTHER): Payer: Medicare Other

## 2015-06-11 ENCOUNTER — Encounter: Payer: Self-pay | Admitting: Internal Medicine

## 2015-06-11 VITALS — BP 136/62 | HR 67 | Temp 98.6°F | Resp 16 | Wt 181.0 lb

## 2015-06-11 DIAGNOSIS — R42 Dizziness and giddiness: Secondary | ICD-10-CM

## 2015-06-11 DIAGNOSIS — I951 Orthostatic hypotension: Secondary | ICD-10-CM

## 2015-06-11 DIAGNOSIS — R5383 Other fatigue: Secondary | ICD-10-CM | POA: Diagnosis not present

## 2015-06-11 LAB — CBC WITH DIFFERENTIAL/PLATELET
BASOS PCT: 0.5 % (ref 0.0–3.0)
Basophils Absolute: 0 10*3/uL (ref 0.0–0.1)
EOS ABS: 0.2 10*3/uL (ref 0.0–0.7)
Eosinophils Relative: 1.5 % (ref 0.0–5.0)
HEMATOCRIT: 42.4 % (ref 36.0–46.0)
Hemoglobin: 14.2 g/dL (ref 12.0–15.0)
Lymphocytes Relative: 42.9 % (ref 12.0–46.0)
Lymphs Abs: 4.4 10*3/uL — ABNORMAL HIGH (ref 0.7–4.0)
MCHC: 33.5 g/dL (ref 30.0–36.0)
MCV: 87.3 fl (ref 78.0–100.0)
MONO ABS: 0.7 10*3/uL (ref 0.1–1.0)
Monocytes Relative: 6.7 % (ref 3.0–12.0)
NEUTROS ABS: 5 10*3/uL (ref 1.4–7.7)
NEUTROS PCT: 48.4 % (ref 43.0–77.0)
Platelets: 233 10*3/uL (ref 150.0–400.0)
RBC: 4.86 Mil/uL (ref 3.87–5.11)
RDW: 14.4 % (ref 11.5–15.5)
WBC: 10.2 10*3/uL (ref 4.0–10.5)

## 2015-06-11 LAB — BASIC METABOLIC PANEL
BUN: 15 mg/dL (ref 6–23)
CO2: 29 mEq/L (ref 19–32)
Calcium: 9.6 mg/dL (ref 8.4–10.5)
Chloride: 104 mEq/L (ref 96–112)
Creatinine, Ser: 0.76 mg/dL (ref 0.40–1.20)
GFR: 80.02 mL/min (ref >60.00)
Glucose, Bld: 93 mg/dL (ref 70–99)
POTASSIUM: 4.5 meq/L (ref 3.5–5.1)
SODIUM: 140 meq/L (ref 135–145)

## 2015-06-11 LAB — TSH: TSH: 1.76 u[IU]/mL (ref 0.35–4.50)

## 2015-06-11 NOTE — Progress Notes (Signed)
Pre visit review using our clinic review tool, if applicable. No additional management support is needed unless otherwise documented below in the visit note. 

## 2015-06-11 NOTE — Progress Notes (Signed)
   Subjective:    Patient ID: Sharon Bauer, female    DOB: May 23, 1946, 69 y.o.   MRN: [id number]  HPI She describes lightheadedness and dizziness which began 2 weeks ago and has progressed. The initial episode occurred after she had been seated at her computer for over an hour. When she stood up she felt lightheaded. The symptoms lasted less than half an hour.  The lightheaded  &dizziness is mainly when she stands after being seated but also occur if she turns quickly. There is no benign positional vertigo symptoms in bed.  She has no associated neuro muscular symptoms with this.There was no cardiac or neurologic prodrome prior to this event.  Also when she walks in the heat she has some lightheadedness. This has been a chronic & recurrent issue.  She was seen in [facility] 7[facility] and given an intranasal steroid for presumed eustachian tube dysfunction. This has been of benefit to a significant degree.  She has also been drinking Gatorade in addition to water which has helped her symptoms.  She does describe fatigue. She is not exercising. She has no bleeding dyscrasias.  She does have nocturia 2-3 times per night which is chronic.   Review of Systems  Denied were any change in heart rhythm or rate prior to the event. There was no associated chest pain or shortness of breath .  Also specifically denied prior to the episode were headache, limb weakness, tingling, or numbness. No seizure activity noted. Epistaxis, hemoptysis, hematuria, melena, or rectal bleeding denied. No unexplained weight loss, significant dyspepsia,dysphagia, or abdominal pain.  There is no abnormal bruising , bleeding, or difficulty stopping bleeding with injury.Fever, chills, sweats, or unexplained weight loss not present. No significant headaches. Mental status change or memory loss denied. Blurred vision , diplopia or vision loss absent. Vertigo, near syncope or imbalance denied. There is no numbness,  tingling, or weakness in extremities.   No loss of control of bladder or bowels. Radicular type pain absent. No seizure stigmata.    Objective:   Physical Exam  Cranial nerve exam is unremarkable. Romberg and finger-nose testing is negative. Gait is normal.  General appearance :adequately nourished; in no distress.  Eyes: No conjunctival inflammation or scleral icterus is present.  Oral exam:  Lips and gums are healthy appearing.There is no oropharyngeal erythema or exudate noted. Dental hygiene is good.  Heart:  Normal rate and regular rhythm. S1 and S2 normal without gallop, murmur, click, rub or other extra sounds    Lungs:Chest clear to auscultation; no wheezes, rhonchi,rales ,or rubs present.No increased work of breathing.   Abdomen: bowel sounds normal, soft and non-tender without masses, organomegaly or hernias noted.  No guarding or rebound.   Vascular : all pulses equal ; no bruits present.  Skin:Warm & dry.  Intact without suspicious lesions or rashes ; no tenting or jaundice   Lymphatic: No lymphadenopathy is noted about the head, neck, axilla  Neuro: Strength, tone & DTRs normal.         Assessment & Plan:  #1 lightheadedness, postural  #2 fatigue  Plan: See orders recommendations.

## 2015-06-11 NOTE — Patient Instructions (Signed)
Perform isometric exercise of calves  ( while seated go up on toes to count of 5 & then onto heels for 5 count). Repeat  4- 5 times prior to standing if you've been seated or supine for any significant period of time as BP drops with such positions.  Drink Gatorade Lite in addition to water if having excessive sweating. This will prevent loss of essential  Electrolytes.

## 2015-09-16 DIAGNOSIS — R42 Dizziness and giddiness: Secondary | ICD-10-CM | POA: Diagnosis not present

## 2015-09-16 DIAGNOSIS — R079 Chest pain, unspecified: Secondary | ICD-10-CM | POA: Diagnosis not present

## 2015-09-16 DIAGNOSIS — R5381 Other malaise: Secondary | ICD-10-CM | POA: Diagnosis not present

## 2015-09-25 DIAGNOSIS — H6121 Impacted cerumen, right ear: Secondary | ICD-10-CM | POA: Diagnosis not present

## 2015-09-25 DIAGNOSIS — H60543 Acute eczematoid otitis externa, bilateral: Secondary | ICD-10-CM | POA: Diagnosis not present

## 2015-09-25 DIAGNOSIS — R42 Dizziness and giddiness: Secondary | ICD-10-CM | POA: Diagnosis not present

## 2015-09-25 DIAGNOSIS — J309 Allergic rhinitis, unspecified: Secondary | ICD-10-CM | POA: Diagnosis not present

## 2015-10-02 DIAGNOSIS — R42 Dizziness and giddiness: Secondary | ICD-10-CM | POA: Diagnosis not present

## 2015-10-02 DIAGNOSIS — R0789 Other chest pain: Secondary | ICD-10-CM | POA: Diagnosis not present

## 2015-10-02 DIAGNOSIS — E782 Mixed hyperlipidemia: Secondary | ICD-10-CM | POA: Diagnosis not present

## 2015-10-02 DIAGNOSIS — R030 Elevated blood-pressure reading, without diagnosis of hypertension: Secondary | ICD-10-CM | POA: Diagnosis not present

## 2015-10-08 DIAGNOSIS — R740 Nonspecific elevation of levels of transaminase and lactic acid dehydrogenase [LDH]: Secondary | ICD-10-CM | POA: Diagnosis not present

## 2015-10-08 DIAGNOSIS — R7989 Other specified abnormal findings of blood chemistry: Secondary | ICD-10-CM | POA: Diagnosis not present

## 2015-10-08 DIAGNOSIS — Z789 Other specified health status: Secondary | ICD-10-CM | POA: Diagnosis not present

## 2015-10-10 DIAGNOSIS — R0789 Other chest pain: Secondary | ICD-10-CM | POA: Diagnosis not present

## 2015-12-11 DIAGNOSIS — E559 Vitamin D deficiency, unspecified: Secondary | ICD-10-CM | POA: Diagnosis not present

## 2015-12-11 DIAGNOSIS — R7303 Prediabetes: Secondary | ICD-10-CM | POA: Diagnosis not present

## 2015-12-11 DIAGNOSIS — E782 Mixed hyperlipidemia: Secondary | ICD-10-CM | POA: Diagnosis not present

## 2015-12-11 DIAGNOSIS — R5381 Other malaise: Secondary | ICD-10-CM | POA: Diagnosis not present

## 2015-12-16 DIAGNOSIS — E559 Vitamin D deficiency, unspecified: Secondary | ICD-10-CM | POA: Diagnosis not present

## 2015-12-16 DIAGNOSIS — R5381 Other malaise: Secondary | ICD-10-CM | POA: Diagnosis not present

## 2015-12-16 DIAGNOSIS — E782 Mixed hyperlipidemia: Secondary | ICD-10-CM | POA: Diagnosis not present

## 2015-12-16 DIAGNOSIS — R748 Abnormal levels of other serum enzymes: Secondary | ICD-10-CM | POA: Diagnosis not present

## 2015-12-16 DIAGNOSIS — R7301 Impaired fasting glucose: Secondary | ICD-10-CM | POA: Diagnosis not present

## 2015-12-22 DIAGNOSIS — J209 Acute bronchitis, unspecified: Secondary | ICD-10-CM | POA: Diagnosis not present

## 2015-12-22 DIAGNOSIS — J069 Acute upper respiratory infection, unspecified: Secondary | ICD-10-CM | POA: Diagnosis not present

## 2015-12-25 DIAGNOSIS — R7303 Prediabetes: Secondary | ICD-10-CM | POA: Diagnosis not present

## 2015-12-25 DIAGNOSIS — R5381 Other malaise: Secondary | ICD-10-CM | POA: Diagnosis not present

## 2015-12-25 DIAGNOSIS — E559 Vitamin D deficiency, unspecified: Secondary | ICD-10-CM | POA: Diagnosis not present

## 2015-12-25 DIAGNOSIS — I10 Essential (primary) hypertension: Secondary | ICD-10-CM | POA: Diagnosis not present

## 2015-12-25 DIAGNOSIS — E782 Mixed hyperlipidemia: Secondary | ICD-10-CM | POA: Diagnosis not present

## 2016-03-16 DIAGNOSIS — H5202 Hypermetropia, left eye: Secondary | ICD-10-CM | POA: Diagnosis not present

## 2016-03-16 DIAGNOSIS — H04123 Dry eye syndrome of bilateral lacrimal glands: Secondary | ICD-10-CM | POA: Diagnosis not present

## 2016-03-16 DIAGNOSIS — H5211 Myopia, right eye: Secondary | ICD-10-CM | POA: Diagnosis not present

## 2016-04-05 ENCOUNTER — Ambulatory Visit (INDEPENDENT_AMBULATORY_CARE_PROVIDER_SITE_OTHER): Payer: Medicare Other | Admitting: Internal Medicine

## 2016-04-05 ENCOUNTER — Encounter: Payer: Self-pay | Admitting: Internal Medicine

## 2016-04-05 VITALS — BP 152/90 | HR 67 | Temp 98.4°F | Resp 16 | Ht 65.0 in | Wt 187.0 lb

## 2016-04-05 DIAGNOSIS — Z0000 Encounter for general adult medical examination without abnormal findings: Secondary | ICD-10-CM

## 2016-04-05 DIAGNOSIS — I10 Essential (primary) hypertension: Secondary | ICD-10-CM | POA: Diagnosis not present

## 2016-04-05 DIAGNOSIS — E785 Hyperlipidemia, unspecified: Secondary | ICD-10-CM

## 2016-04-05 NOTE — Assessment & Plan Note (Signed)
Has never been on medication and refuses to go on any statin because of possible side effects

## 2016-04-05 NOTE — Patient Instructions (Addendum)
Sharon Bauer , Thank you for taking time to come for your Medicare Wellness Visit. I appreciate your ongoing commitment to your health goals. Please review the following plan we discussed and let me know if I can assist you in the future.   These are the goals we discussed: Goals    Restart regular exercise      This is a list of the screening recommended for you and due dates:  Health Maintenance  Topic Date Due  . Tetanus Vaccine  09/04/1964  . Shingles Vaccine  09/04/2005  . Pneumonia vaccines (1 of 2 - PCV13) 11/22/2018*  . Mammogram  05/16/2016  . Flu Shot  06/22/2016  . Colon Cancer Screening  07/14/2021  . DEXA scan (bone density measurement)  Completed  .  Hepatitis C: One time screening is recommended by Center for Disease Control  (CDC) for  adults born from [birth year] through 1966.   Addressed  *Topic was postponed. The date shown is not the original due date.    Health Maintenance, Female Adopting a healthy lifestyle and getting preventive care can go a long way to promote health and wellness. Talk with your health care provider about what schedule of regular examinations is right for you. This is a good chance for you to check in with your provider about disease prevention and staying healthy. In between checkups, there are plenty of things you can do on your own. Experts have done a lot of research about which lifestyle changes and preventive measures are most likely to keep you healthy. Ask your health care provider for more information. WEIGHT AND DIET  Eat a healthy diet  Be sure to include plenty of vegetables, fruits, low-fat dairy products, and lean protein.  Do not eat a lot of foods high in solid fats, added sugars, or salt.  Get regular exercise. This is one of the most important things you can do for your health.  Most adults should exercise for at least 150 minutes each week. The exercise should increase your heart rate and make you sweat (moderate-intensity  exercise).  Most adults should also do strengthening exercises at least twice a week. This is in addition to the moderate-intensity exercise.  Maintain a healthy weight  Body mass index (BMI) is a measurement that can be used to identify possible weight problems. It estimates body fat based on height and weight. Your health care provider can help determine your BMI and help you achieve or maintain a healthy weight.  For females 101 years of age and older:   A BMI below 18.5 is considered underweight.  A BMI of 18.5 to 24.9 is normal.  A BMI of 25 to 29.9 is considered overweight.  A BMI of 30 and above is considered obese.  Watch levels of cholesterol and blood lipids  You should start having your blood tested for lipids and cholesterol at 71 years of age, then have this test every 5 years.  You may need to have your cholesterol levels checked more often if:  Your lipid or cholesterol levels are high.  You are older than 71 years of age.  You are at high risk for heart disease.  CANCER SCREENING   Lung Cancer  Lung cancer screening is recommended for adults 79-62 years old who are at high risk for lung cancer because of a history of smoking.  A yearly low-dose CT scan of the lungs is recommended for people who:  Currently smoke.  Have quit within the  past 15 years.  Have at least a 30-pack-year history of smoking. A pack year is smoking an average of one pack of cigarettes a day for 1 year.  Yearly screening should continue until it has been 15 years since you quit.  Yearly screening should stop if you develop a health problem that would prevent you from having lung cancer treatment.  Breast Cancer  Practice breast self-awareness. This means understanding how your breasts normally appear and feel.  It also means doing regular breast self-exams. Let your health care provider know about any changes, no matter how small.  If you are in your 20s or 30s, you should  have a clinical breast exam (CBE) by a health care provider every 1-3 years as part of a regular health exam.  If you are 35 or older, have a CBE every year. Also consider having a breast X-ray (mammogram) every year.  If you have a family history of breast cancer, talk to your health care provider about genetic screening.  If you are at high risk for breast cancer, talk to your health care provider about having an MRI and a mammogram every year.  Breast cancer gene (BRCA) assessment is recommended for women who have family members with BRCA-related cancers. BRCA-related cancers include:  Breast.  Ovarian.  Tubal.  Peritoneal cancers.  Results of the assessment will determine the need for genetic counseling and BRCA1 and BRCA2 testing. Cervical Cancer Your health care provider may recommend that you be screened regularly for cancer of the pelvic organs (ovaries, uterus, and vagina). This screening involves a pelvic examination, including checking for microscopic changes to the surface of your cervix (Pap test). You may be encouraged to have this screening done every 3 years, beginning at age 20.  For women ages 38-65, health care providers may recommend pelvic exams and Pap testing every 3 years, or they may recommend the Pap and pelvic exam, combined with testing for human papilloma virus (HPV), every 5 years. Some types of HPV increase your risk of cervical cancer. Testing for HPV may also be done on women of any age with unclear Pap test results.  Other health care providers may not recommend any screening for nonpregnant women who are considered low risk for pelvic cancer and who do not have symptoms. Ask your health care provider if a screening pelvic exam is right for you.  If you have had past treatment for cervical cancer or a condition that could lead to cancer, you need Pap tests and screening for cancer for at least 20 years after your treatment. If Pap tests have been  discontinued, your risk factors (such as having a new sexual partner) need to be reassessed to determine if screening should resume. Some women have medical problems that increase the chance of getting cervical cancer. In these cases, your health care provider may recommend more frequent screening and Pap tests. Colorectal Cancer  This type of cancer can be detected and often prevented.  Routine colorectal cancer screening usually begins at 71 years of age and continues through 71 years of age.  Your health care provider may recommend screening at an earlier age if you have risk factors for colon cancer.  Your health care provider may also recommend using home test kits to check for hidden blood in the stool.  A small camera at the end of a tube can be used to examine your colon directly (sigmoidoscopy or colonoscopy). This is done to check for the earliest forms  of colorectal cancer.  Routine screening usually begins at age 41.  Direct examination of the colon should be repeated every 5-10 years through 71 years of age. However, you may need to be screened more often if early forms of precancerous polyps or small growths are found. Skin Cancer  Check your skin from head to toe regularly.  Tell your health care provider about any new moles or changes in moles, especially if there is a change in a mole's shape or color.  Also tell your health care provider if you have a mole that is larger than the size of a pencil eraser.  Always use sunscreen. Apply sunscreen liberally and repeatedly throughout the day.  Protect yourself by wearing long sleeves, pants, a wide-brimmed hat, and sunglasses whenever you are outside. HEART DISEASE, DIABETES, AND HIGH BLOOD PRESSURE   High blood pressure causes heart disease and increases the risk of stroke. High blood pressure is more likely to develop in:  People who have blood pressure in the high end of the normal range (130-139/85-89 mm Hg).  People  who are overweight or obese.  People who are African American.  If you are 21-88 years of age, have your blood pressure checked every 3-5 years. If you are 52 years of age or older, have your blood pressure checked every year. You should have your blood pressure measured twice--once when you are at a hospital or clinic, and once when you are not at a hospital or clinic. Record the average of the two measurements. To check your blood pressure when you are not at a hospital or clinic, you can use:  An automated blood pressure machine at a pharmacy.  A home blood pressure monitor.  If you are between 55 years and 63 years old, ask your health care provider if you should take aspirin to prevent strokes.  Have regular diabetes screenings. This involves taking a blood sample to check your fasting blood sugar level.  If you are at a normal weight and have a low risk for diabetes, have this test once every three years after 71 years of age.  If you are overweight and have a high risk for diabetes, consider being tested at a younger age or more often. PREVENTING INFECTION  Hepatitis B  If you have a higher risk for hepatitis B, you should be screened for this virus. You are considered at high risk for hepatitis B if:  You were born in a country where hepatitis B is common. Ask your health care provider which countries are considered high risk.  Your parents were born in a high-risk country, and you have not been immunized against hepatitis B (hepatitis B vaccine).  You have HIV or AIDS.  You use needles to inject street drugs.  You live with someone who has hepatitis B.  You have had sex with someone who has hepatitis B.  You get hemodialysis treatment.  You take certain medicines for conditions, including cancer, organ transplantation, and autoimmune conditions. Hepatitis C  Blood testing is recommended for:  Everyone born from [birth year] through 1966.  Anyone with known risk factors for  hepatitis C. Sexually transmitted infections (STIs)  You should be screened for sexually transmitted infections (STIs) including gonorrhea and chlamydia if:  You are sexually active and are younger than 71 years of age.  You are older than 71 years of age and your health care provider tells you that you are at risk for this type of infection.  Your  sexual activity has changed since you were last screened and you are at an increased risk for chlamydia or gonorrhea. Ask your health care provider if you are at risk.  If you do not have HIV, but are at risk, it may be recommended that you take a prescription medicine daily to prevent HIV infection. This is called pre-exposure prophylaxis (PrEP). You are considered at risk if:  You are sexually active and do not regularly use condoms or know the HIV status of your partner(s).  You take drugs by injection.  You are sexually active with a partner who has HIV. Talk with your health care provider about whether you are at high risk of being infected with HIV. If you choose to begin PrEP, you should first be tested for HIV. You should then be tested every 3 months for as long as you are taking PrEP.  PREGNANCY   If you are premenopausal and you may become pregnant, ask your health care provider about preconception counseling.  If you may become pregnant, take 400 to 800 micrograms (mcg) of folic acid every day.  If you want to prevent pregnancy, talk to your health care provider about birth control (contraception). OSTEOPOROSIS AND MENOPAUSE   Osteoporosis is a disease in which the bones lose minerals and strength with aging. This can result in serious bone fractures. Your risk for osteoporosis can be identified using a bone density scan.  If you are 15 years of age or older, or if you are at risk for osteoporosis and fractures, ask your health care provider if you should be screened.  Ask your health care provider whether you should take a  calcium or vitamin D supplement to lower your risk for osteoporosis.  Menopause may have certain physical symptoms and risks.  Hormone replacement therapy may reduce some of these symptoms and risks. Talk to your health care provider about whether hormone replacement therapy is right for you.  HOME CARE INSTRUCTIONS   Schedule regular health, dental, and eye exams.  Stay current with your immunizations.   Do not use any tobacco products including cigarettes, chewing tobacco, or electronic cigarettes.  If you are pregnant, do not drink alcohol.  If you are breastfeeding, limit how much and how often you drink alcohol.  Limit alcohol intake to no more than 1 drink per day for nonpregnant women. One drink equals 12 ounces of beer, 5 ounces of wine, or 1 ounces of hard liquor.  Do not use street drugs.  Do not share needles.  Ask your health care provider for help if you need support or information about quitting drugs.  Tell your health care provider if you often feel depressed.  Tell your health care provider if you have ever been abused or do not feel safe at home.   This information is not intended to replace advice given to you by your health care provider. Make sure you discuss any questions you have with your health care provider.   Document Released: 05/24/2011 Document Revised: 11/29/2014 Document Reviewed: 10/10/2013 Elsevier Interactive Patient Education [employer].

## 2016-04-05 NOTE — Assessment & Plan Note (Signed)
Has been off medication and it has been controlled She does monitor at home Restart regular exercise Monitor off medication-advised that she needs to monitor closely

## 2016-04-05 NOTE — Progress Notes (Signed)
Pre visit review using our clinic review tool, if applicable. No additional management support is needed unless otherwise documented below in the visit note. 

## 2016-04-05 NOTE — Progress Notes (Signed)
Subjective:    Patient ID: Sharon Bauer, female    DOB: [birth year], 70 y.o.   MRN: [id number]  HPI She is here to establish with a new pcp.    She had an episode of feeling complete exhaustion that hit her suddenly.  She was walking and felt like she has concrete poured into her.  Everything felt heavy.  She saw her pcp.  Slightly elevated lfts, normal stress test.  When walking felt like she was leaning toward to right.  No pain.  No headaches.  No changes in vision.  It lasted over a month or so and slowly eased up.  She was not able to walk regular, which she did for exercise.  She does not feel this is an issue anymore and is not concerned about it.  Hypertension: She was taken off her medication and her blood pressure has been well controlled off of medication. She did lose weight and that helps. She is currently not exercising regularly, but plans on restarting. The regular exercise also helped. She does monitor her blood pressure has been controlled.  Hyperlipidemia: She does not want to take a cholesterol-lowering medication. Lifestyle changes has helped in the past, but her cholesterol is still elevated. She has had it checked in the past year. She is not want to take medication.  She does want work on improving her lifestyle. At one point she was eating very healthy, but needs to get back into that. She plans on seeing a holistic doctor to help with some of her supplements.  Here for medicare wellness exam.   I have personally reviewed and have noted 1.The patient's medical and social history 2.Their use of alcohol, tobacco or illicit drugs 3.Their current medications and supplements 4.The patient's functional ability including ADL's, fall risks, home safety risks and hearing or visual impairment. 5.Diet and physical activities 6.Evidence for depression or mood disorders 7.Care team reviewed and updated  - Dr  Einar Gip (cardiology)   Are there smokers in your home (other than you)? No  Risk Factors Exercise: starting to exercise regularly-mostly walks Dietary issues discussed: improving diet, some days does not do as well - tries to eat lots of vegetables  Cardiac risk factors: advanced age, hypertension (controlled per patient), hyperlipidemia, and obesity (BMI >= 30 kg/m2).  Depression Screen  Have you felt down, depressed or hopeless? No  Have you felt little interest or pleasure in doing things?  No  Activities of Daily Living In your present state of health, do you have any difficulty performing the following activities?:  Driving? No Managing money?  No Feeding yourself? No Getting from bed to chair? No Climbing a flight of stairs? No Preparing food and eating?: No Bathing or showering? No Getting dressed: No Getting to/using the toilet? No Moving around from place to place: No In the past year have you fallen or had a near fall?: No   Are you sexually active?  No  Do you have more than one partner?  N/A  Hearing Difficulties: yes-tested in the past and it is minimal Do you often ask people to speak up or repeat themselves? No Do you experience ringing or noises in your ears? No Do you have difficulty understanding soft or whispered voices? No Vision:              Any change in vision: no              Up to date with eye exam:  yes Memory:  Do you feel that you have a problem with memory? No, except occasional recall of a specific noun  Do you often misplace items? No  Do you feel safe at home?  Yes  Cognitive Testing  Alert, Orientated? Yes  Normal Appearance? Yes  Recall of three objects?  Yes  Can perform simple calculations? Yes  Displays appropriate judgment? Yes  Can read the correct time from a watch face? Yes   Advanced Directives have been discussed with the patient? Yes   Medications and allergies reviewed with patient and updated if appropriate.  Patient  Active Problem List   Diagnosis Date Noted  . Dizziness 06/07/2015  . Rhinitis 03/17/2011  . 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 VITAMIN D DEFICIENCY 04/22/2009  . REACTIVE AIRWAY DISEASE 09/24/2008  . OSTEOPENIA 08/12/2008  . FASTING HYPERGLYCEMIA 08/12/2008  . NONSPEC ELEVATION OF LEVELS OF TRANSAMINASE/LDH 10/05/2007  . Hyperlipidemia 10/02/2007  . DEPRESSION 04/26/2007  . HYPERTENSION 04/26/2007    Current Outpatient Prescriptions on File Prior to Visit  Medication Sig Dispense Refill  . b complex vitamins tablet Take 1 tablet by mouth daily.      . Calcium-Vitamin D (CALTRATE 600 PLUS-VIT D PO) Take by mouth.      . Cholecalciferol (VITAMIN D) 2000 UNITS CAPS Take 10,000 Units by mouth daily.     . Coenzyme Q10 (COQ10 PO) Take by mouth.      . fish oil-omega-3 fatty acids 1000 MG capsule Take 2 g by mouth daily.      . fluticasone (FLONASE) 50 MCG/ACT nasal spray 1 spray in each nostril twice a day as needed. Use the "crossover" technique as discussed 16 g 2   No current facility-administered medications on file prior to visit.    Past Medical History  Diagnosis Date  . Depression     situational  . Hypertension   . Hyperlipidemia   . Elevated LFTs   . Osteopenia   . Rosacea     Past Surgical History  Procedure Laterality Date  . Cesarean section    . Abdominal hysterectomy  1982    due to fibroids  . Tonsillectomy  1960  . Abdominoplasty  1994  . Colonoscopy    . Nasal septum surgery    . Cataract extraction, bilateral  2014    Social History   Social History  . Marital Status: Married    Spouse Name: N/A  . Number of Children: N/A  . Years of Education: N/A   Social History Main Topics  . Smoking status: Never Smoker   . Smokeless tobacco: Never Used  . Alcohol Use: No     Comment: rare  . Drug Use: No  . Sexual Activity: Not Asked   Other Topics Concern  . None   Social History Narrative   Exercise - none regularly, typically walks  regularly    Family History  Problem Relation Age of Onset  . Heart attack Maternal Uncle 55  . Heart disease Maternal Uncle 66    MI  . Heart attack Maternal Aunt 60  . Mental illness Maternal Aunt     [place]  . Heart attack Maternal Uncle 65  . Heart disease Maternal Uncle 59    MI  . Diabetes Maternal Grandfather   . Asthma Paternal Aunt   . Asthma Paternal Aunt   . Alzheimer's disease Maternal Aunt   . Heart disease Maternal Aunt 60    MI  . Heart attack Father  37s  . Heart failure Father   . COPD Father   . Hypertension Father   . Emphysema Father   . Heart disease Father 26    MI, CHF,  . Hypertension Mother   . Hyperlipidemia Mother   . Osteoporosis Mother   . Osteoarthritis Mother   . Hyperlipidemia Sister   . Colon cancer Paternal Grandmother   . Cancer Paternal Grandfather     COLON    Review of Systems  Constitutional: Negative for fever, chills, appetite change and fatigue.  HENT: Positive for hearing loss (right ear). Negative for tinnitus and trouble swallowing.   Eyes: Negative for visual disturbance.  Respiratory: Positive for cough (allergy, when eat - told she had a small esophagus). Negative for shortness of breath and wheezing.   Cardiovascular: Positive for leg swelling. Negative for chest pain and palpitations.  Gastrointestinal: Positive for constipation (mild). Negative for nausea, abdominal pain and blood in stool.       Occ gerd  Genitourinary: Negative for dysuria and hematuria.  Neurological: Negative for headaches.  Psychiatric/Behavioral: Negative for dysphoric mood. The patient is not nervous/anxious.        Objective:   Filed Vitals:   04/05/16 1545  BP: 152/90  Pulse: 67  Temp: 98.4 F (36.9 C)  Resp: 16   Filed Weights   04/05/16 1545  Weight: 187 lb (84.823 kg)   Body mass index is 31.12 kg/(m^2).   Physical Exam Constitutional: She appears well-developed and well-nourished. No distress.  HENT:  Head:  Normocephalic and atraumatic.  Right Ear: External ear normal. Normal ear canal and TM Left Ear: External ear normal.  Normal ear canal and TM Mouth/Throat: Oropharynx is clear and moist.  Eyes: Conjunctivae and EOM are normal.  Neck: Neck supple. No tracheal deviation present. No thyromegaly present.  No carotid bruit  Cardiovascular: Normal rate, regular rhythm and normal heart sounds.   No murmur heard.  No edema. Pulmonary/Chest: Effort normal and breath sounds normal. No respiratory distress. She has no wheezes. She has no rales.  Breast: deferred to Gyn Abdominal: Soft. She exhibits no distension. There is no tenderness.  Lymphadenopathy: She has no cervical adenopathy.  Skin: Skin is warm and dry. She is not diaphoretic.  Psychiatric: She has a normal mood and affect. Her behavior is normal.         Assessment & Plan:   Wellness Exam Immunizations-Discussed tetanus and shingles-deferred both Colonoscopy-up-to-date Mammogram-up-to-date dexa-up-to-date Gyn-we'll schedule Eye exam-up-to-date Hearing loss-minimal hearing loss, has been tested in the past. She does not feel this is an issue and is not need to be referred Memory concerns/difficulties-concern only with recall of a knee for now. No other memory concerns Independent of ADLs-completely independent   See Problem List for Assessment and Plan of chronic medical problems.

## 2016-04-26 DIAGNOSIS — M545 Low back pain: Secondary | ICD-10-CM | POA: Diagnosis not present

## 2016-04-26 DIAGNOSIS — S86912A Strain of unspecified muscle(s) and tendon(s) at lower leg level, left leg, initial encounter: Secondary | ICD-10-CM | POA: Diagnosis not present

## 2016-04-26 DIAGNOSIS — S86911A Strain of unspecified muscle(s) and tendon(s) at lower leg level, right leg, initial encounter: Secondary | ICD-10-CM | POA: Diagnosis not present

## 2016-04-27 ENCOUNTER — Other Ambulatory Visit: Payer: Self-pay | Admitting: Orthopedic Surgery

## 2016-04-27 DIAGNOSIS — M545 Low back pain: Secondary | ICD-10-CM

## 2016-04-27 DIAGNOSIS — M79605 Pain in left leg: Secondary | ICD-10-CM

## 2016-04-27 DIAGNOSIS — M79604 Pain in right leg: Secondary | ICD-10-CM

## 2016-05-03 ENCOUNTER — Ambulatory Visit
Admission: RE | Admit: 2016-05-03 | Discharge: 2016-05-03 | Disposition: A | Discharge status: Home / Self Care | Payer: Medicare Other | Source: Ambulatory Visit | Attending: Orthopedic Surgery | Admitting: Orthopedic Surgery

## 2016-05-03 DIAGNOSIS — M79604 Pain in right leg: Secondary | ICD-10-CM

## 2016-05-03 DIAGNOSIS — M5126 Other intervertebral disc displacement, lumbar region: Secondary | ICD-10-CM | POA: Diagnosis not present

## 2016-05-03 DIAGNOSIS — M545 Low back pain: Secondary | ICD-10-CM

## 2016-05-03 DIAGNOSIS — M79605 Pain in left leg: Secondary | ICD-10-CM

## 2016-05-03 IMAGING — MR MR LUMBAR SPINE W/O CM
4 of 5 series · 18 of 48 positions shown · non-contrast
Comparison: [DATE]

CLINICAL DATA: Low back pain. Right buttock pain. Pulling injury
3-4 weeks ago.

EXAM:
MRI LUMBAR SPINE WITHOUT CONTRAST
TECHNIQUE: Multiplanar, multisequence MR imaging of the lumbar spine was
performed. No intravenous contrast was administered.

[Series 6: T2 · sagittal · 4.0mm · 0.73mm/px · 6 of 13 slices shown (1 of 2)]
[im 1/13]
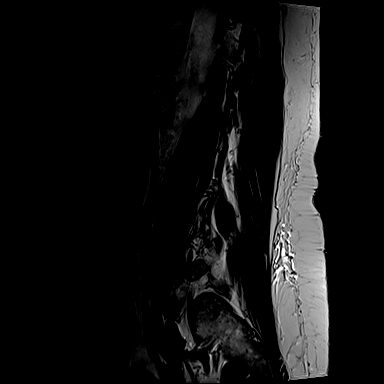
[im 3/13]
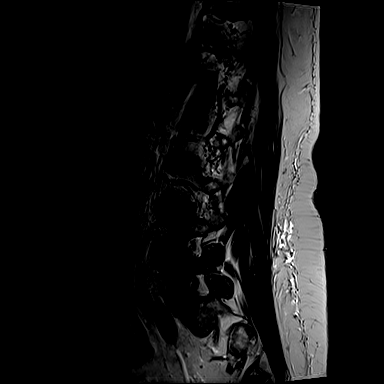
[im 5/13]
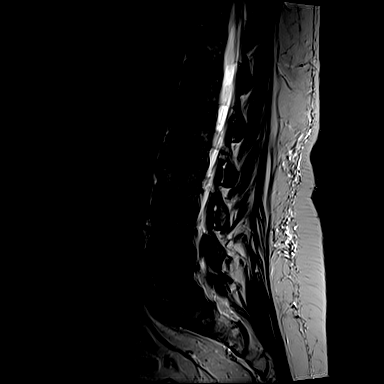
[im 8/13]
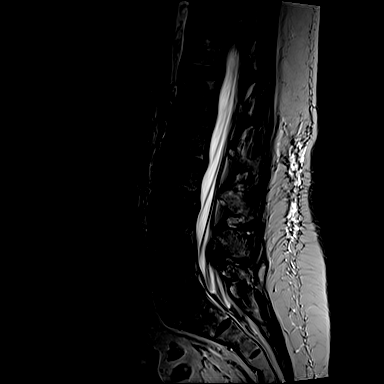
[im 10/13]
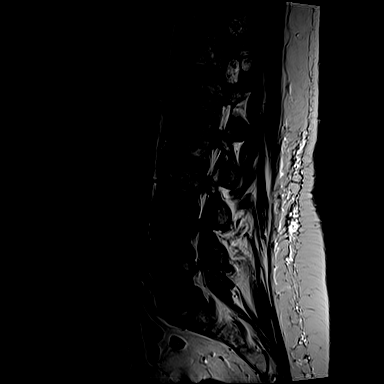
[im 13/13]
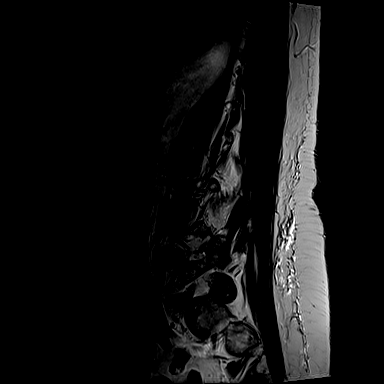

[Series 7: T1 · sagittal · 4.0mm · 0.73mm/px · 3 of 13 slices shown (1 of 2)]
[im 3/13]
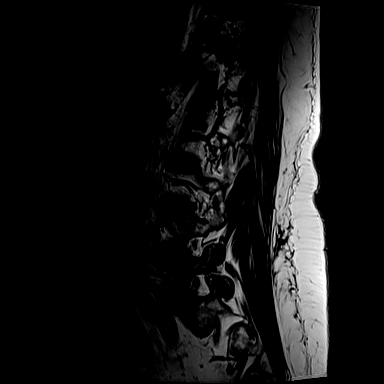
[im 8/13]
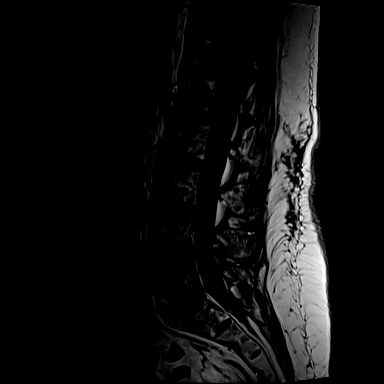
[im 13/13]
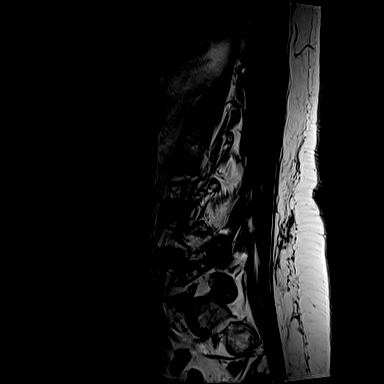

[Series 11: T1 · axial · 4.0mm · 0.28mm/px · z∈[-50,+83]mm · 3 of 34 slices shown (2 of 2)]
[im 5/34]
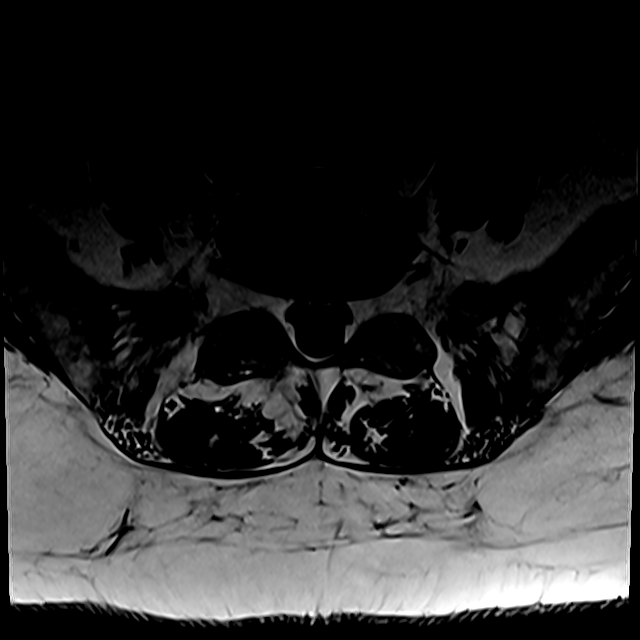
[im 17/34]
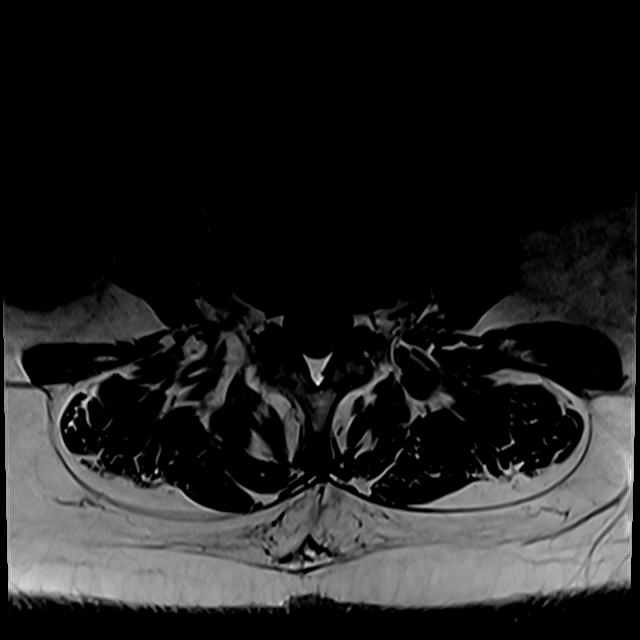
[im 29/34]
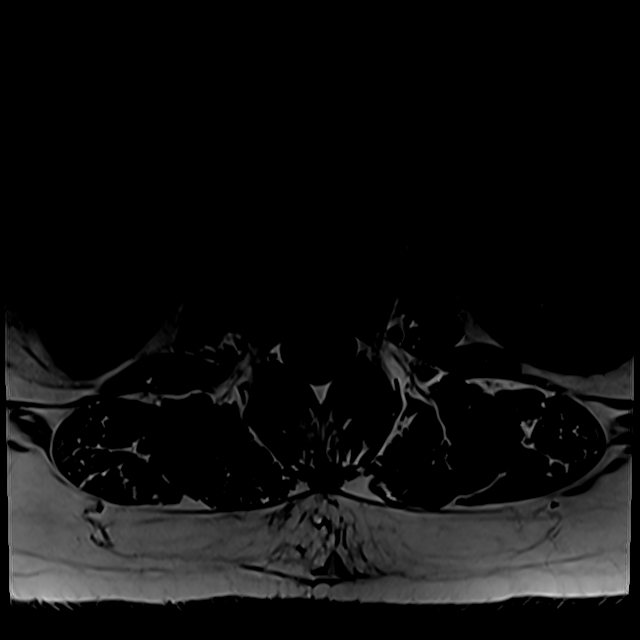

[Series 14: T2 · axial · 4.0mm · 0.28mm/px · z∈[-70,+83]mm · 6 of 34 slices shown (2 of 2)]
[im 1/34]
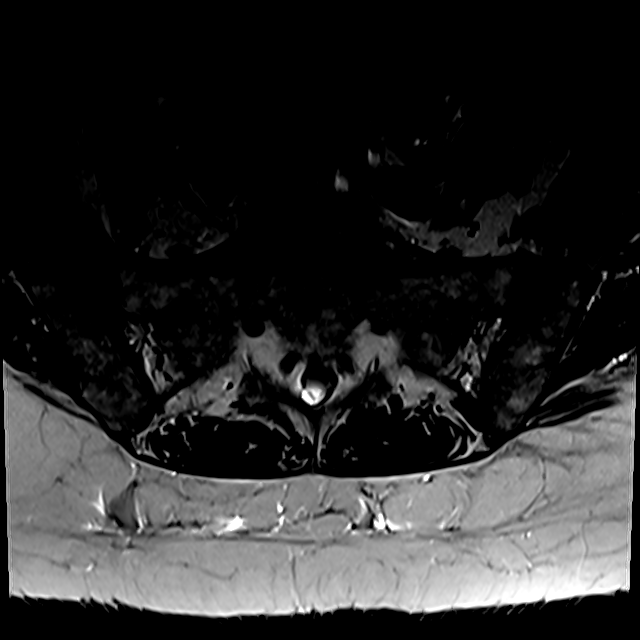
[im 5/34]
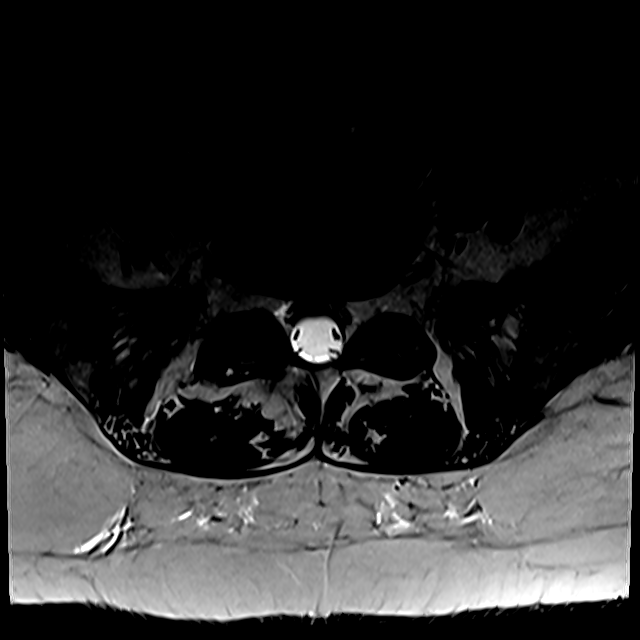
[im 10/34]
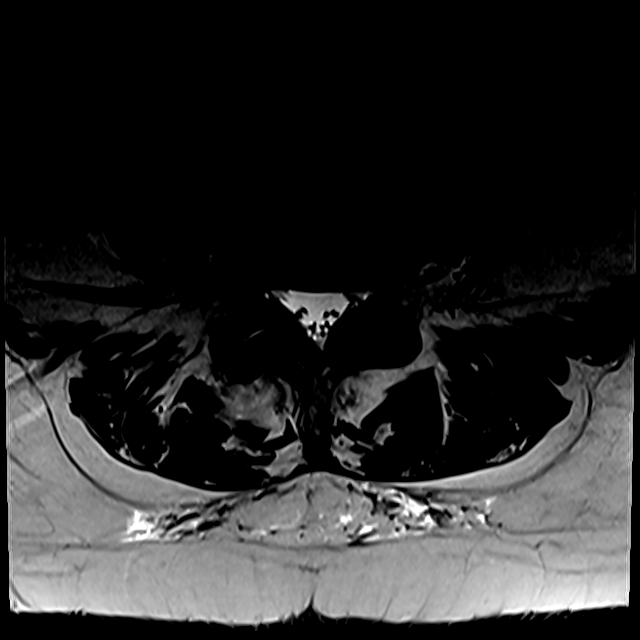
[im 15/34]
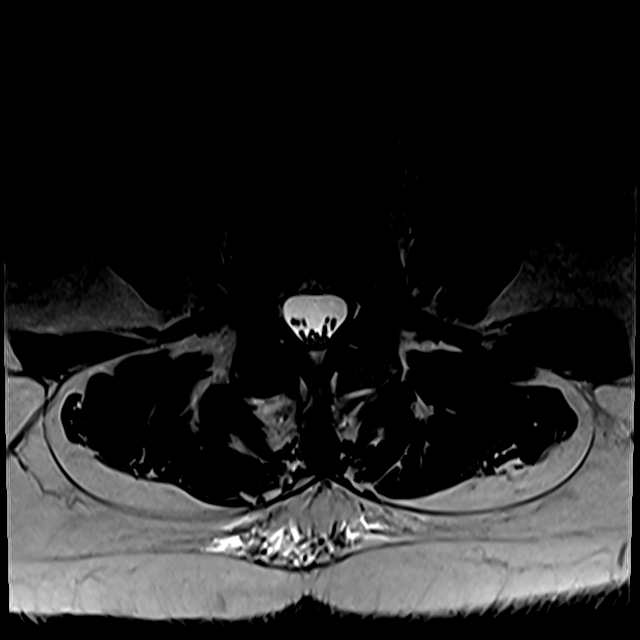
[im 17/34]
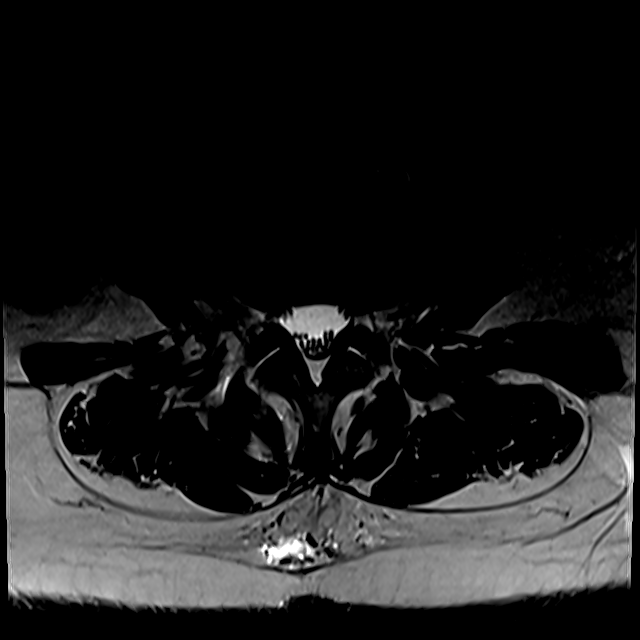
[im 29/34]
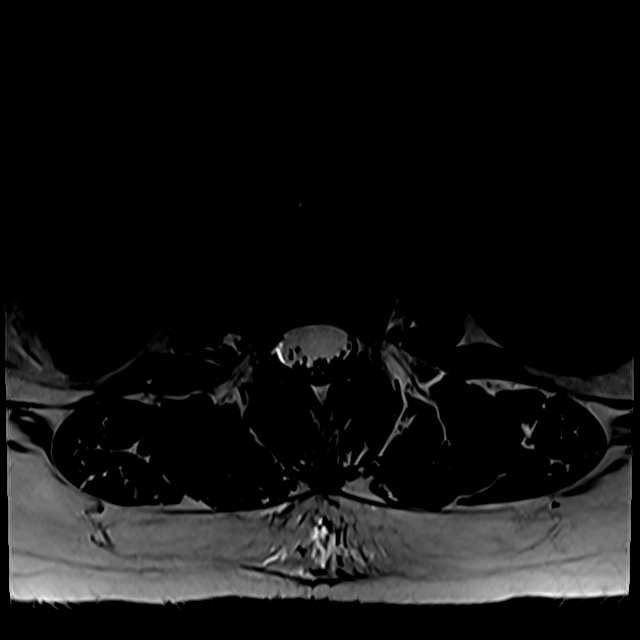

[18 of 48 positions shown; findings below may reference images not displayed]

FINDINGS: Segmentation: The lowest lumbar type non-rib-bearing vertebra is
labeled as L5.

Alignment:  Unremarkable

Vertebrae: Marrow heterogeneity is present. Although this can be
caused by marrow infiltrative processes, the most common causes
include anemia, smoking, obesity, or advancing age. 6 mm T2
hyperintense lesion centrally in the L3 vertebral body is
technically nonspecific although statistically likely to be an
atypical hemangioma or similar benign lesion.

There is prominent right facet and perifacet edema at L4-5.

Conus medullaris: Extends to the T12-L1 level and appears normal.

Paraspinal and other soft tissues: Unremarkable

Disc levels:

L1-2: Unremarkable.

L2- 3: Unremarkable.

L3-4:  No impingement.  Mild disc bulge.

L4-5: No impingement. Disc bulge with right greater than left facet
arthropathy and ligamentum flavum redundancy. No overt facet joint
effusion. Small synovial cyst extending posteriorly from the facet
joint best seen on the parasagittal images.

L5-S1: No impingement. Mild disc bulge. Mild right facet
arthropathy.
IMPRESSION: 1. Notable right facet edema at the L4-5 level along with perifacet
edema. Much of this is likely degenerative. If the patient is having
fever or other signs of infection then the possibility of septic
facet joint might be raised, but base purely on imaging this is
considered unlikely given the lack of a significant facet joint
effusion and the lack of bony destructive findings. I do not see a
fracture in the vicinity of the facet joint, although CT might
provide mildly greater sensitivity in this regard.
2. 6 mm T2 hyperintense lesion in the L3 vertebral body is
technically nonspecific, although statistically likely to be a
atypical hemangioma or similar benign lesion.
3. No direct nerve root impingement or central narrowing of the
thecal sac is identified in the lumbar spine.
4. Marrow heterogeneity is present. Although this can be caused by
marrow infiltrative processes, the most common causes include
anemia, smoking, obesity, or advancing age.

## 2016-05-06 ENCOUNTER — Other Ambulatory Visit: Payer: Medicare Other

## 2016-05-10 DIAGNOSIS — M545 Low back pain: Secondary | ICD-10-CM | POA: Diagnosis not present

## 2016-05-17 DIAGNOSIS — M6281 Muscle weakness (generalized): Secondary | ICD-10-CM | POA: Diagnosis not present

## 2016-05-17 DIAGNOSIS — M546 Pain in thoracic spine: Secondary | ICD-10-CM | POA: Diagnosis not present

## 2016-05-17 DIAGNOSIS — M545 Low back pain: Secondary | ICD-10-CM | POA: Diagnosis not present

## 2016-05-17 DIAGNOSIS — M542 Cervicalgia: Secondary | ICD-10-CM | POA: Diagnosis not present

## 2016-05-20 DIAGNOSIS — M545 Low back pain: Secondary | ICD-10-CM | POA: Diagnosis not present

## 2016-05-20 DIAGNOSIS — M542 Cervicalgia: Secondary | ICD-10-CM | POA: Diagnosis not present

## 2016-05-20 DIAGNOSIS — M6281 Muscle weakness (generalized): Secondary | ICD-10-CM | POA: Diagnosis not present

## 2016-05-20 DIAGNOSIS — M546 Pain in thoracic spine: Secondary | ICD-10-CM | POA: Diagnosis not present

## 2016-05-24 DIAGNOSIS — M542 Cervicalgia: Secondary | ICD-10-CM | POA: Diagnosis not present

## 2016-05-24 DIAGNOSIS — M545 Low back pain: Secondary | ICD-10-CM | POA: Diagnosis not present

## 2016-05-24 DIAGNOSIS — M6281 Muscle weakness (generalized): Secondary | ICD-10-CM | POA: Diagnosis not present

## 2016-05-24 DIAGNOSIS — M546 Pain in thoracic spine: Secondary | ICD-10-CM | POA: Diagnosis not present

## 2016-05-26 DIAGNOSIS — M6281 Muscle weakness (generalized): Secondary | ICD-10-CM | POA: Diagnosis not present

## 2016-05-26 DIAGNOSIS — M545 Low back pain: Secondary | ICD-10-CM | POA: Diagnosis not present

## 2016-05-26 DIAGNOSIS — M546 Pain in thoracic spine: Secondary | ICD-10-CM | POA: Diagnosis not present

## 2016-05-26 DIAGNOSIS — M542 Cervicalgia: Secondary | ICD-10-CM | POA: Diagnosis not present

## 2016-05-31 DIAGNOSIS — M6281 Muscle weakness (generalized): Secondary | ICD-10-CM | POA: Diagnosis not present

## 2016-05-31 DIAGNOSIS — M545 Low back pain: Secondary | ICD-10-CM | POA: Diagnosis not present

## 2016-05-31 DIAGNOSIS — M542 Cervicalgia: Secondary | ICD-10-CM | POA: Diagnosis not present

## 2016-05-31 DIAGNOSIS — M546 Pain in thoracic spine: Secondary | ICD-10-CM | POA: Diagnosis not present

## 2016-06-08 DIAGNOSIS — L820 Inflamed seborrheic keratosis: Secondary | ICD-10-CM | POA: Diagnosis not present

## 2016-06-08 DIAGNOSIS — B078 Other viral warts: Secondary | ICD-10-CM | POA: Diagnosis not present

## 2016-06-08 DIAGNOSIS — Z1283 Encounter for screening for malignant neoplasm of skin: Secondary | ICD-10-CM | POA: Diagnosis not present

## 2016-06-08 DIAGNOSIS — L821 Other seborrheic keratosis: Secondary | ICD-10-CM | POA: Diagnosis not present

## 2016-07-21 ENCOUNTER — Ambulatory Visit (INDEPENDENT_AMBULATORY_CARE_PROVIDER_SITE_OTHER): Payer: Medicare Other | Admitting: Nurse Practitioner

## 2016-07-21 ENCOUNTER — Encounter: Payer: Self-pay | Admitting: Nurse Practitioner

## 2016-07-21 VITALS — BP 122/78 | HR 100 | Temp 99.3°F | Wt 182.0 lb

## 2016-07-21 DIAGNOSIS — J069 Acute upper respiratory infection, unspecified: Secondary | ICD-10-CM

## 2016-07-21 LAB — POCT INFLUENZA A/B

## 2016-07-21 MED ORDER — HYDROCODONE-HOMATROPINE 5-1.5 MG/5ML PO SYRP: 5.0000 mL | ORAL_SOLUTION | Freq: Every evening | ORAL | 0 refills | Status: DC | PRN

## 2016-07-21 MED ORDER — AZITHROMYCIN 250 MG PO TABS: 250.0000 mg | ORAL_TABLET | Freq: Every day | ORAL | 0 refills | Status: DC

## 2016-07-21 MED ORDER — IPRATROPIUM BROMIDE 0.03 % NA SOLN: 2.0000 | Freq: Two times a day (BID) | NASAL | 12 refills | Status: DC

## 2016-07-21 MED ORDER — DM-GUAIFENESIN ER 30-600 MG PO TB12: 1.0000 | ORAL_TABLET | Freq: Two times a day (BID) | ORAL | 0 refills | Status: DC | PRN

## 2016-07-21 NOTE — Patient Instructions (Signed)
URI Instructions: Use over-the-counter  "cold" medicines  such as "Tylenol cold" , "Advil cold",  "Mucinex" or" Mucinex D"  for cough and congestion.  Avoid decongestants if you have high blood pressure. Use" Delsym" or" Robitussin" cough syrup varietis for cough.  You can use plain "Tylenol" or "Advi"l for fever, chills and achyness.   "Common cold" symptoms are usually triggered by a virus.  The antibiotics are usually not necessary. On average, a" viral cold" illness would take 4-7 days to resolve. Please, make an appointment if you are not better or if you're worse.   Start azithromycin if no improvement in 5days. Push oral fluids

## 2016-07-21 NOTE — Progress Notes (Signed)
Pre visit review using our clinic review tool, if applicable. No additional management support is needed unless otherwise documented below in the visit note. 

## 2016-07-21 NOTE — Progress Notes (Signed)
Subjective:  Patient ID: Sharon Bauer, female    DOB: [birth year]  Age: 70 y.o. MRN: [id number]  CC: No chief complaint on file.   Sharon Bauer presents for cough and nasal congestion  URI   This is a new problem. The current episode started yesterday. The maximum temperature recorded prior to her arrival was 100.4 - 100.9 F. Associated symptoms include congestion, coughing, headaches, joint pain, a plugged ear sensation, rhinorrhea, sinus pain, sneezing, a sore throat and swollen glands. Pertinent negatives include no abdominal pain, chest pain, diarrhea, dysuria, ear pain, joint swelling, nausea, neck pain, rash, vomiting or wheezing. Treatments tried: saline and airborne OTC. The treatment provided no relief.    Outpatient Medications Prior to Visit  Medication Sig Dispense Refill  . b complex vitamins tablet Take 1 tablet by mouth daily.      . Calcium-Vitamin D (CALTRATE 600 PLUS-VIT D PO) Take by mouth.      . Cholecalciferol (VITAMIN D) 2000 UNITS CAPS Take 10,000 Units by mouth daily.     . Coenzyme Q10 (COQ10 PO) Take by mouth.      . fish oil-omega-3 fatty acids 1000 MG capsule Take 2 g by mouth daily.      . fluticasone (FLONASE) 50 MCG/ACT nasal spray 1 spray in each nostril twice a day as needed. Use the "crossover" technique as discussed 16 g 2   No facility-administered medications prior to visit.     ROS See HPI  Objective:  BP 122/78   Pulse 100   Temp 99.3 F (37.4 C)   Wt 182 lb (82.6 kg)   SpO2 96%   BMI 30.29 kg/m   BP Readings from Last 3 Encounters:  07/21/16 122/78  04/05/16 (!) 152/90  06/11/15 136/62    Wt Readings from Last 3 Encounters:  07/21/16 182 lb (82.6 kg)  05/03/16 180 lb (81.6 kg)  04/05/16 187 lb (84.8 kg)    Physical Exam  Constitutional: She is oriented to person, place, and time. She appears well-developed. No distress.  HENT:  Right Ear: Tympanic membrane and ear canal normal.  Left Ear: Tympanic membrane and ear  canal normal.  Nose: Mucosal edema and rhinorrhea present. Right sinus exhibits maxillary sinus tenderness and frontal sinus tenderness. Left sinus exhibits maxillary sinus tenderness and frontal sinus tenderness.  Mouth/Throat: Uvula is midline. Posterior oropharyngeal erythema present. No oropharyngeal exudate or posterior oropharyngeal edema.  Eyes: Conjunctivae and EOM are normal. Pupils are equal, round, and reactive to light.  Neck: Normal range of motion. Neck supple.  Cardiovascular: Normal rate and normal heart sounds.   Pulmonary/Chest: Effort normal and breath sounds normal.  Lymphadenopathy:    She has cervical adenopathy.  Neurological: She is alert and oriented to person, place, and time.  Skin: Skin is warm and dry.  Vitals reviewed.   Lab Results  Component Value Date   WBC 10.2 06/11/2015   HGB 14.2 06/11/2015   HCT 42.4 06/11/2015   PLT 233.0 06/11/2015   GLUCOSE 93 06/11/2015   CHOL 165 09/27/2011   TRIG 131.0 09/27/2011   HDL 56.30 09/27/2011   LDLDIRECT 117.0 03/10/2010   LDLCALC 83 09/27/2011   ALT 39 (H) 09/27/2011   AST 35 09/27/2011   NA 140 06/11/2015   K 4.5 06/11/2015   CL 104 06/11/2015   CREATININE 0.76 06/11/2015   BUN 15 06/11/2015   CO2 29 06/11/2015   TSH 1.76 06/11/2015   HGBA1C 6.1 06/01/2010    Assessment &  Plan:   Diagnoses and all orders for this visit:  Acute URI -     Cancel: POCT Influenza A -     POCT Influenza A/B -     dextromethorphan-guaiFENesin (MUCINEX DM) 30-600 MG 12hr tablet; Take 1 tablet by mouth 2 (two) times daily as needed for cough. -     ipratropium (ATROVENT) 0.03 % nasal spray; Place 2 sprays into both nostrils 2 (two) times daily. Do not use for more than 5days. -     HYDROcodone-homatropine (HYCODAN) 5-1.5 MG/5ML syrup; Take 5 mLs by mouth at bedtime as needed for cough. -     azithromycin (ZITHROMAX Z-PAK) 250 MG tablet; Take 1 tablet (250 mg total) by mouth daily. Take 2tabs on first day, then 1tab once a  day till complete   I am having Ms. Bloomquist start on dextromethorphan-guaiFENesin, ipratropium, HYDROcodone-homatropine, and azithromycin. I am also having her maintain her b complex vitamins, Calcium-Vitamin D (CALTRATE 600 PLUS-VIT D PO), Coenzyme Q10 (COQ10 PO), Vitamin D, fish oil-omega-3 fatty acids, and fluticasone.  Meds ordered this encounter  Medications  . dextromethorphan-guaiFENesin (MUCINEX DM) 30-600 MG 12hr tablet    Sig: Take 1 tablet by mouth 2 (two) times daily as needed for cough.    Dispense:  14 tablet    Refill:  0    Order Specific Question:   Supervising Provider    Answer:   Cassandria Anger [[id number]]  . ipratropium (ATROVENT) 0.03 % nasal spray    Sig: Place 2 sprays into both nostrils 2 (two) times daily. Do not use for more than 5days.    Dispense:  30 mL    Refill:  12    Order Specific Question:   Supervising Provider    Answer:   Cassandria Anger [[id number]]  . HYDROcodone-homatropine (HYCODAN) 5-1.5 MG/5ML syrup    Sig: Take 5 mLs by mouth at bedtime as needed for cough.    Dispense:  120 mL    Refill:  0    Order Specific Question:   Supervising Provider    Answer:   Cassandria Anger [[id number]]  . azithromycin (ZITHROMAX Z-PAK) 250 MG tablet    Sig: Take 1 tablet (250 mg total) by mouth daily. Take 2tabs on first day, then 1tab once a day till complete    Dispense:  6 tablet    Refill:  0    Order Specific Question:   Supervising Provider    Answer:   Cassandria Anger [[id number]]    Follow-up: Return if symptoms worsen or fail to improve.  Wilfred Lacy, NP

## 2016-07-29 ENCOUNTER — Encounter: Payer: Self-pay | Admitting: Internal Medicine

## 2016-08-02 ENCOUNTER — Ambulatory Visit (INDEPENDENT_AMBULATORY_CARE_PROVIDER_SITE_OTHER): Payer: Medicare Other | Admitting: Internal Medicine

## 2016-08-02 ENCOUNTER — Encounter: Payer: Self-pay | Admitting: Internal Medicine

## 2016-08-02 DIAGNOSIS — J302 Other seasonal allergic rhinitis: Secondary | ICD-10-CM

## 2016-08-02 NOTE — Progress Notes (Signed)
Pre visit review using our clinic review tool, if applicable. No additional management support is needed unless otherwise documented below in the visit note. 

## 2016-08-02 NOTE — Assessment & Plan Note (Signed)
She will resume taking her flonase and continue zyrtec. Can use ibuprofen 400 mg BID for inflammation. Does not need antibiotics or steroids today. Reassurance was given about typical course and expected improvement. She will stop the atrovent nasal spray as it did not help her.

## 2016-08-02 NOTE — Progress Notes (Signed)
   Subjective:    Patient ID: Sharon Bauer, female    DOB: 1945-12-05, 70 y.o.   MRN: [id number]  HPI The patient is a 70 YO female coming in for allergy symptoms. She was treated for bronchitis back about 2 weeks ago. She took azithromycin and cough syrup as well as atrovent spray. She was asked to stop taking flonase and she did. She is still taking zyrtec and using nasal rinses as well. She felt the flonase did better. She denies fevers or chills. Some right ear fullness. No hearing changes. No throat pain. Still coughing some but not productive and no SOB or wheezing.   Review of Systems  Constitutional: Negative for activity change, appetite change, chills, fatigue, fever and unexpected weight change.  HENT: Positive for congestion, ear pain, postnasal drip and rhinorrhea. Negative for ear discharge, sinus pressure, sore throat and trouble swallowing.   Eyes: Negative.   Respiratory: Positive for cough. Negative for chest tightness, shortness of breath and wheezing.        Improving gradually  Cardiovascular: Negative for chest pain, palpitations and leg swelling.  Gastrointestinal: Negative.   Musculoskeletal: Negative.   Neurological: Negative.       Objective:   Physical Exam  Constitutional: She appears well-developed and well-nourished. No distress.  HENT:  Head: Normocephalic and atraumatic.  Right ear with some wax, mild redness of the ear canals but TM normal. Oropharynx with mild clear drainage, no nasal crusting.   Eyes: EOM are normal.  Neck: No JVD present.  Cardiovascular: Normal rate and regular rhythm.   Pulmonary/Chest: Effort normal and breath sounds normal. No respiratory distress. She has no wheezes. She has no rales.  Abdominal: Soft. She exhibits no distension. There is no tenderness.  Lymphadenopathy:    She has no cervical adenopathy.  Skin: Skin is warm and dry.   Vitals:   08/02/16 1122 08/02/16 1149  BP: (!) 160/92 (!) 148/84  Pulse: 78   Resp:  16   Temp: 99.1 F (37.3 C)   TempSrc: Oral   SpO2: 97%   Weight: 183 lb 12.8 oz (83.4 kg)   Height: 5\' 5"  (1.651 m)       Assessment & Plan:

## 2016-08-02 NOTE — Patient Instructions (Signed)
Go back to taking the flonase and continue taking the zyrtec.   I would recommend to start taking ibuprofen 400 mg (2 pills over the counter) twice a day.

## 2016-08-23 DIAGNOSIS — H6121 Impacted cerumen, right ear: Secondary | ICD-10-CM | POA: Diagnosis not present

## 2016-08-23 DIAGNOSIS — H902 Conductive hearing loss, unspecified: Secondary | ICD-10-CM | POA: Diagnosis not present

## 2016-09-06 ENCOUNTER — Encounter: Payer: Self-pay | Admitting: Internal Medicine

## 2016-09-07 MED ORDER — HYDROCODONE-HOMATROPINE 5-1.5 MG/5ML PO SYRP: 5.0000 mL | ORAL_SOLUTION | ORAL | 0 refills | Status: DC | PRN

## 2016-09-07 NOTE — Addendum Note (Signed)
Addended by: Binnie Rail on: 09/07/2016 08:41 PM   Modules accepted: Orders

## 2016-09-07 NOTE — Telephone Encounter (Signed)
Prescription refilled.  Let her know if she needs any more she should come in for evaluation again.

## 2016-12-03 DIAGNOSIS — B078 Other viral warts: Secondary | ICD-10-CM | POA: Diagnosis not present

## 2016-12-03 DIAGNOSIS — L820 Inflamed seborrheic keratosis: Secondary | ICD-10-CM | POA: Diagnosis not present

## 2016-12-15 ENCOUNTER — Encounter (INDEPENDENT_AMBULATORY_CARE_PROVIDER_SITE_OTHER): Payer: Self-pay | Admitting: Orthopedic Surgery

## 2016-12-15 ENCOUNTER — Ambulatory Visit (INDEPENDENT_AMBULATORY_CARE_PROVIDER_SITE_OTHER): Payer: Medicare Other | Admitting: Orthopedic Surgery

## 2016-12-15 ENCOUNTER — Ambulatory Visit (INDEPENDENT_AMBULATORY_CARE_PROVIDER_SITE_OTHER): Payer: Medicare Other

## 2016-12-15 DIAGNOSIS — M79671 Pain in right foot: Secondary | ICD-10-CM | POA: Insufficient documentation

## 2016-12-15 DIAGNOSIS — M79645 Pain in left finger(s): Secondary | ICD-10-CM

## 2016-12-15 NOTE — Progress Notes (Signed)
Office Visit Note   Patient: Sharon Bauer           Date of Birth: 1946/09/03           MRN: [id number] Visit Date: 12/15/2016 Requested by: Binnie Rail, MD [place], [place] [id number] PCP: Binnie Rail, MD  Subjective: Chief Complaint  Patient presents with  . Left Hand - Pain  . 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  Been going on for 2 weeks.  Denies any history of injury.  She does not like to take medication.  Opening doors and pressure on the radial aspect of the PIP joint is painful.  Denies any numbness and tingling.  Patient also describes right heel pain for the past 2 months.  Patient states she pronates when she walks.  Reports pain all around the back of her heel and Achilles area.  Again denies any history of injury in this area.  Does not hurt all the time.  She recently got a pair of shoes from [employer] which included inserts.  I inspected the shoes and they look very good in terms of arch support and appropriateness for her foot.              Review of Systems All systems reviewed are negative as they relate to the chief complaint within the history of present illness.  Patient denies  fevers or chills.    Assessment & Plan: Visit Diagnoses:  1. Pain of right heel   2. Pain in left finger(s)     Plan: Impression is left index finger pain which does not really look much like significant arthritis at this time.  2 possibilities are early degenerative wear and tear which is giving her pain were to just be more like capsular irritation on that radial side.  Another try her with some Pennsaid samples for that index finger.  I don't think an injection or systemic anti-inflammatories are indicated at this time for that problem.  In regards to the right heel pain is his more of a management problem.  She has significant spurring at the Achilles tendon attachment site.  I think  Pennsaid to be useful down there as well.  Unfortunately her insurance does not cover that prescription but I did provide her some samples.  He'll for stretching is important.  Nothing really operative do at this time.  I'll see her back as needed  Follow-Up Instructions: Return if symptoms worsen or fail to improve.   Orders:  Orders Placed This Encounter  Procedures  . XR Finger Index Left  . XR Ankle 2 Views Right   No orders of the defined types were placed in this encounter.     Procedures: No procedures performed   Clinical Data: No additional findings.  Objective: Vital Signs: There were no vitals taken for this visit.  Physical Exam   Constitutional: Patient appears well-developed HEENT:  Head: Normocephalic Eyes:EOM are normal Neck: Normal range of motion Cardiovascular: Normal rate Pulmonary/chest: Effort normal Neurologic: Patient is alert Skin: Skin is warm Psychiatric: Patient has normal mood and affect    Ortho Exam examination of the left index finger demonstrates full symmetric range of motion and good collateral ligament stability.  Motor sensory function is intact.  Both flexor tendons are functional and intact extensor tendon functional and intact.  Not much in way of swelling around  the joint.  No real warmth warm.  In regards to the right heel patient does have slight tenderness at the Achilles tendon insertion but good ankle dorsi and plantar flexion eversion even eversion strength.  Palpable pedal pulses no paresthesias negative squeeze test on the calcaneus otherwise symmetric tibiotalar subtalar transverse tarsal range of motion.  Specialty Comments:  No specialty comments available.  Imaging: Xr Ankle 2 Views Right  Result Date: 12/15/2016 Right calcaneus 2 views reviewed.  Patient has spurring in the plantar fashion attachment site as well as more significant spurring at the Achilles attachment site.  No evidence of stress fracture around the  calcaneus.  Osteophyte is not fractured off the posterior aspect of the calcaneus  Xr Finger Index Left  Result Date: 12/15/2016 2 views left index finger reviewed.  No asymmetric degenerative joint change is present compared to the long finger.  Not much in way of spurring or fracture is present.  No soft tissue swelling present around the PIP or DIP joint    PMFS History: Patient Active Problem List   Diagnosis Date Noted  . Pain of right heel 12/15/2016  . Pain in left finger(s) 12/15/2016  . Dizziness 06/07/2015  . Rhinitis 03/17/2011  . 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 VITAMIN D DEFICIENCY 04/22/2009  . REACTIVE AIRWAY DISEASE 09/24/2008  . OSTEOPENIA 08/12/2008  . FASTING HYPERGLYCEMIA 08/12/2008  . NONSPEC ELEVATION OF LEVELS OF TRANSAMINASE/LDH 10/05/2007  . Hyperlipidemia 10/02/2007  . Essential hypertension 04/26/2007   Past Medical History:  Diagnosis Date  . Depression    situational  . DEPRESSION 04/26/2007   Qualifier: Diagnosis of  By: Janelle Floor    . Elevated LFTs   . Hyperlipidemia   . Hypertension   . Osteopenia   . Rosacea     Family History  Problem Relation Age of Onset  . Heart attack Father     23s  . Heart failure Father   . COPD Father   . Hypertension Father   . Emphysema Father   . Heart disease Father 12    MI, CHF,  . Hypertension Mother   . Hyperlipidemia Mother   . Osteoporosis Mother   . Osteoarthritis Mother   . Heart attack Maternal Uncle 55  . Heart disease Maternal Uncle 4    MI  . Heart attack Maternal Aunt 60  . Mental illness Maternal Aunt     AL[place]HEIMERS  . Heart attack Maternal Uncle 65  . Heart disease Maternal Uncle 13    MI  . Diabetes Maternal Grandfather   . Asthma Paternal Aunt   . Asthma Paternal Aunt   . Alzheimer's disease Maternal Aunt   . Heart disease Maternal Aunt 60    MI  . Hyperlipidemia Sister   . Colon cancer Paternal Grandmother   . Cancer Paternal Grandfather     COLON    Past  Surgical History:  Procedure Laterality Date  . ABDOMINAL HYSTERECTOMY  1982   due to fibroids  . ABDOMINOPLASTY  1994  . CATARACT EXTRACTION, BILATERAL  2014  . CESAREAN SECTION    . COLONOSCOPY    . NASAL SEPTUM SURGERY    . TONSILLECTOMY  1960   Social History   Occupational History  . Not on file.   Social History Main Topics  . Smoking status: Never Smoker  . Smokeless tobacco: Never Used  . Alcohol use No     Comment: rare  . Drug use: No  . Sexual activity:  Not on file

## 2017-02-24 NOTE — Progress Notes (Signed)
Subjective:    Patient ID: Sharon Bauer, female    DOB: 25-Jul-1946, 71 y.o.   MRN: [id number]  HPI She is here for an acute visit for swollen glands.  She states she is a poor historian - she does not pay attention to her body.  Sore throat x 3 weeks, LN swollen for a while and not going away.  Bad taste in mouth.  She has some intermittent nasal congestion.  She has allergies in the spring and did start taking zyrtec one week ago.  She is unsure if this is helping or not.       She has gained weight.  She thinks it is from not exercising.  Her appetite is normal.  She denies cough, wheeze, sob now.  She has wheeze only when exposed to dust or smoke.   She denies fevers but feels hot sometimes - like a hot flash.  Has eczema in right ear.  Sometimes she gets wax build up in her right ear and sees ENT as needed.  She is not sure if that is related.  She denies ear pain, pressure or change in her hearing.    She is following with a nutritionist.  She has done a cleanse - para 1, para 2 -- she has seen tape worms in her stool.   Her knees hurt intermittently and she does not think she has arthritis. Her muscles in her legs hurt.  The pain is intermittent.  No other muscles hurt in her back or arms.  Elevated bp:  She has been on medication in the past.  She does not monitor her BP.  She has not been exercising.   Medications and allergies reviewed with patient and updated if appropriate.  Patient Active Problem List   Diagnosis Date Noted  . Pain of right heel 12/15/2016  . Pain in left finger(s) 12/15/2016  . Dizziness 06/07/2015  . Rhinitis 03/17/2011  . 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 VITAMIN D DEFICIENCY 04/22/2009  . REACTIVE AIRWAY DISEASE 09/24/2008  . OSTEOPENIA 08/12/2008  . FASTING HYPERGLYCEMIA 08/12/2008  . NONSPEC ELEVATION OF LEVELS OF TRANSAMINASE/LDH 10/05/2007  . Hyperlipidemia 10/02/2007  . Essential hypertension 04/26/2007    Current Outpatient  Prescriptions on File Prior to Visit  Medication Sig Dispense Refill  . b complex vitamins tablet Take 1 tablet by mouth daily.      . Coenzyme Q10 (COQ10 PO) Take by mouth.      . fish oil-omega-3 fatty acids 1000 MG capsule Take 2 g by mouth daily.      . fluticasone (FLONASE) 50 MCG/ACT nasal spray 1 spray in each nostril twice a day as needed. Use the "crossover" technique as discussed 16 g 2   No current facility-administered medications on file prior to visit.     Past Medical History:  Diagnosis Date  . Depression    situational  . DEPRESSION 04/26/2007   Qualifier: Diagnosis of  By: Janelle Floor    . Elevated LFTs   . Hyperlipidemia   . Hypertension   . Osteopenia   . Rosacea     Past Surgical History:  Procedure Laterality Date  . ABDOMINAL HYSTERECTOMY  1982   due to fibroids  . ABDOMINOPLASTY  1994  . CATARACT EXTRACTION, BILATERAL  2014  . CESAREAN SECTION    . COLONOSCOPY    . NASAL SEPTUM SURGERY    . [place]    Social History   Social  History  . Marital status: Married    Spouse name: N/A  . Number of children: N/A  . Years of education: N/A   Social History Main Topics  . Smoking status: Never Smoker  . Smokeless tobacco: Never Used  . Alcohol use No     Comment: rare  . Drug use: No  . Sexual activity: Not on file   Other Topics Concern  . Not on file   Social History Narrative   Exercise - none regularly, typically walks regularly    Family History  Problem Relation Age of Onset  . Heart attack Father     54s  . Heart failure Father   . COPD Father   . Hypertension Father   . Emphysema Father   . Heart disease Father 74    MI, CHF,  . Hypertension Mother   . Hyperlipidemia Mother   . Osteoporosis Mother   . Osteoarthritis Mother   . Heart attack Maternal Uncle 55  . Heart disease Maternal Uncle 53    MI  . Heart attack Maternal Aunt 60  . Mental illness Maternal Aunt     ALZHEIMERS  . Heart attack Maternal  Uncle 65  . Heart disease Maternal Uncle 66    MI  . Diabetes Maternal Grandfather   . Asthma Paternal Aunt   . Asthma Paternal Aunt   . Alzheimer's disease Maternal Aunt   . Heart disease Maternal Aunt 60    MI  . Hyperlipidemia Sister   . Colon cancer Paternal Grandmother   . Cancer Paternal Grandfather     COLON    Review of Systems  Constitutional: Negative for appetite change, fever and unexpected weight change (weight gain).       Feels hot sometimes - feel like a hot flash - intermittent  HENT: Positive for congestion (intermittent) and sore throat. Negative for ear pain, postnasal drip, sinus pain and sinus pressure.        Swollen neck glands  Respiratory: Positive for wheezing (occ - dust, smoke). Negative for cough and shortness of breath.   Cardiovascular: Positive for palpitations (occ). Negative for chest pain.  Gastrointestinal: Negative for abdominal pain, blood in stool, constipation, diarrhea and nausea.  Genitourinary: Negative for dysuria and hematuria.  Musculoskeletal: Positive for arthralgias (knees) and myalgias (intermittent). Negative for back pain.       Occasionally feels a sting in her muscles  Neurological: Positive for dizziness (occasional) and tremors (hands - subtle, intermittent).       Objective:   Vitals:   02/25/17 1123  BP: (!) 164/84  Pulse: 73  Resp: 16  Temp: 98.2 F (36.8 C)   Filed Weights   02/25/17 1123  Weight: 187 lb (84.8 kg)   Body mass index is 31.12 kg/m.  Wt Readings from Last 3 Encounters:  02/25/17 187 lb (84.8 kg)  08/02/16 183 lb 12.8 oz (83.4 kg)  07/21/16 182 lb (82.6 kg)     Physical Exam  Constitutional: She is oriented to person, place, and time. She appears well-developed and well-nourished. No distress.  HENT:  Head: Normocephalic and atraumatic.  Right Ear: External ear normal.  Left Ear: External ear normal.  Mouth/Throat: Oropharynx is clear and moist. No oropharyngeal exudate.  Normal ear  canals and TM b/l with scant cerumen b/l  Eyes: Conjunctivae are normal.  Neck: Normal range of motion. Neck supple. No tracheal deviation present. No thyromegaly present.  Cardiovascular: Normal rate, regular rhythm and normal heart sounds.  No murmur heard. Pulmonary/Chest: Effort normal and breath sounds normal. No respiratory distress. She has no wheezes. She has no rales.  Abdominal: Soft. She exhibits no distension. There is no tenderness. There is no rebound.  Musculoskeletal: She exhibits no edema or tenderness.  No muscle tenderness in legs, arms or back with palpation  Lymphadenopathy:    She has cervical adenopathy (mild).  Neurological: She is alert and oriented to person, place, and time.  Mild tremor in fingers  Skin: Skin is warm and dry. She is not diaphoretic.  Psychiatric: She has a normal mood and affect. Her behavior is normal.          Assessment & Plan:   See Problem List for Assessment and Plan of chronic medical problems.

## 2017-02-25 ENCOUNTER — Ambulatory Visit (INDEPENDENT_AMBULATORY_CARE_PROVIDER_SITE_OTHER): Payer: Medicare Other | Admitting: Internal Medicine

## 2017-02-25 ENCOUNTER — Other Ambulatory Visit (INDEPENDENT_AMBULATORY_CARE_PROVIDER_SITE_OTHER): Payer: Medicare Other

## 2017-02-25 VITALS — BP 164/84 | HR 73 | Temp 98.2°F | Resp 16 | Wt 187.0 lb

## 2017-02-25 DIAGNOSIS — M791 Myalgia, unspecified site: Secondary | ICD-10-CM | POA: Insufficient documentation

## 2017-02-25 DIAGNOSIS — R7309 Other abnormal glucose: Secondary | ICD-10-CM | POA: Diagnosis not present

## 2017-02-25 DIAGNOSIS — R590 Localized enlarged lymph nodes: Secondary | ICD-10-CM

## 2017-02-25 DIAGNOSIS — I10 Essential (primary) hypertension: Secondary | ICD-10-CM | POA: Diagnosis not present

## 2017-02-25 DIAGNOSIS — J029 Acute pharyngitis, unspecified: Secondary | ICD-10-CM | POA: Diagnosis not present

## 2017-02-25 LAB — CBC WITH DIFFERENTIAL/PLATELET
BASOS PCT: 1 % (ref 0.0–3.0)
Basophils Absolute: 0.1 10*3/uL (ref 0.0–0.1)
EOS ABS: 0.2 10*3/uL (ref 0.0–0.7)
EOS PCT: 2.6 % (ref 0.0–5.0)
HEMATOCRIT: 42.2 % (ref 36.0–46.0)
Hemoglobin: 13.9 g/dL (ref 12.0–15.0)
LYMPHS PCT: 44.4 % (ref 12.0–46.0)
Lymphs Abs: 3.9 10*3/uL (ref 0.7–4.0)
MCHC: 32.9 g/dL (ref 30.0–36.0)
MCV: 87.3 fl (ref 78.0–100.0)
Monocytes Absolute: 0.6 10*3/uL (ref 0.1–1.0)
Monocytes Relative: 6.6 % (ref 3.0–12.0)
NEUTROS ABS: 4 10*3/uL (ref 1.4–7.7)
Neutrophils Relative %: 45.4 % (ref 43.0–77.0)
PLATELETS: 221 10*3/uL (ref 150.0–400.0)
RBC: 4.84 Mil/uL (ref 3.87–5.11)
RDW: 14.1 % (ref 11.5–15.5)
WBC: 8.8 10*3/uL (ref 4.0–10.5)

## 2017-02-25 LAB — COMPREHENSIVE METABOLIC PANEL
ALT: 27 U/L (ref 0–35)
AST: 19 U/L (ref 0–37)
Albumin: 4.1 g/dL (ref 3.5–5.2)
Alkaline Phosphatase: 56 U/L (ref 39–117)
BUN: 15 mg/dL (ref 6–23)
CHLORIDE: 105 meq/L (ref 96–112)
CO2: 29 meq/L (ref 19–32)
Calcium: 9.1 mg/dL (ref 8.4–10.5)
Creatinine, Ser: 0.83 mg/dL (ref 0.40–1.20)
GFR: 71.93 mL/min (ref >60.00)
GLUCOSE: 94 mg/dL (ref 70–99)
Potassium: 4.1 mEq/L (ref 3.5–5.1)
Sodium: 140 mEq/L (ref 135–145)
Total Bilirubin: 0.5 mg/dL (ref 0.2–1.2)
Total Protein: 6.7 g/dL (ref 6.0–8.3)

## 2017-02-25 LAB — C-REACTIVE PROTEIN: CRP: 0.7 mg/dL (ref 0.5–20.0)

## 2017-02-25 LAB — HEMOGLOBIN A1C: Hgb A1c MFr Bld: 6 % (ref 4.6–6.5)

## 2017-02-25 LAB — TSH: TSH: 1.8 u[IU]/mL (ref 0.35–4.50)

## 2017-02-25 LAB — SEDIMENTATION RATE: SED RATE: 5 mm/h (ref 0–30)

## 2017-02-25 MED ORDER — CETIRIZINE HCL 10 MG PO TABS: 10.0000 mg | ORAL_TABLET | Freq: Every day | ORAL | 11 refills | Status: AC

## 2017-02-25 NOTE — Progress Notes (Signed)
Pre visit review using our clinic review tool, if applicable. No additional management support is needed unless otherwise documented below in the visit note. 

## 2017-02-25 NOTE — Patient Instructions (Addendum)
  Test(s) ordered today. Your results will be released to [facility] (or called to you) after review, usually within 72hours after test completion. If any changes need to be made, you will be notified at that same time.  Start monitoring your blood pressure at home - the goal is less than 130/80 ideally, definitely less than 140/90. If it is not controlled, return so we can initiate medication.   If your symptoms do not improve, please return for follow up.

## 2017-02-26 ENCOUNTER — Encounter: Payer: Self-pay | Admitting: Internal Medicine

## 2017-02-26 DIAGNOSIS — J029 Acute pharyngitis, unspecified: Secondary | ICD-10-CM | POA: Insufficient documentation

## 2017-02-26 DIAGNOSIS — R590 Localized enlarged lymph nodes: Secondary | ICD-10-CM | POA: Insufficient documentation

## 2017-02-26 NOTE — Assessment & Plan Note (Signed)
Legs only ? Related to not exercising Intermittent Will check esr - doubt PMR, not consistent with fibromyalgia, possibly knee OA Start regular exercise - walking She deferred sports medicine referral 

## 2017-02-26 NOTE — Assessment & Plan Note (Addendum)
BP elevated today - she does not monitor it at home She was on medication previously but was able to come off with weight loss Most likely she needs to be on medication which I explained to her, but she would like to monitor her BP before starting medication Discussed consequences of uncontrolled BP - return if BP elevated at home - discussed goal cmp, tsh

## 2017-02-26 NOTE — Assessment & Plan Note (Signed)
No evidence of infection Likely related to allergies Zyrtec, flonase

## 2017-02-26 NOTE — Assessment & Plan Note (Signed)
Check a1c 

## 2017-02-26 NOTE — Assessment & Plan Note (Signed)
Mild on exam, no alarm symptoms Likely related to allergies Continue zyrtec Start flonase - can use saline nasal spray for dryness Check basic labs, including cbc

## 2017-03-28 ENCOUNTER — Telehealth: Payer: Self-pay | Admitting: Internal Medicine

## 2017-03-28 NOTE — Telephone Encounter (Signed)
Sharon Bauer was scheduled for CPE on 04/06/17. Called patient to notify her that Medicare Part A and B does not cover CPE, but visit can be changed to yearly f/u. Patient stated that she will be out of town on 04/06/17 and will need to cx appt for that day. Patient stated that she will call office when she is ready to r/s.

## 2017-04-06 ENCOUNTER — Encounter: Payer: Medicare Other | Admitting: Internal Medicine

## 2017-05-09 DIAGNOSIS — Z1389 Encounter for screening for other disorder: Secondary | ICD-10-CM | POA: Diagnosis not present

## 2017-05-09 DIAGNOSIS — M791 Myalgia: Secondary | ICD-10-CM | POA: Diagnosis not present

## 2017-05-09 DIAGNOSIS — M859 Disorder of bone density and structure, unspecified: Secondary | ICD-10-CM | POA: Diagnosis not present

## 2017-05-09 DIAGNOSIS — Z6830 Body mass index (BMI) 30.0-30.9, adult: Secondary | ICD-10-CM | POA: Diagnosis not present

## 2017-05-09 DIAGNOSIS — F4321 Adjustment disorder with depressed mood: Secondary | ICD-10-CM | POA: Diagnosis not present

## 2017-05-09 DIAGNOSIS — E784 Other hyperlipidemia: Secondary | ICD-10-CM | POA: Diagnosis not present

## 2017-05-09 DIAGNOSIS — F900 Attention-deficit hyperactivity disorder, predominantly inattentive type: Secondary | ICD-10-CM | POA: Diagnosis not present

## 2017-05-10 ENCOUNTER — Other Ambulatory Visit: Payer: Self-pay | Admitting: Internal Medicine

## 2017-05-10 DIAGNOSIS — Z1231 Encounter for screening mammogram for malignant neoplasm of breast: Secondary | ICD-10-CM

## 2017-05-17 ENCOUNTER — Ambulatory Visit
Admission: RE | Admit: 2017-05-17 | Discharge: 2017-05-17 | Disposition: A | Discharge status: Home / Self Care | Payer: Medicare Other | Source: Ambulatory Visit | Attending: Internal Medicine | Admitting: Internal Medicine

## 2017-05-17 DIAGNOSIS — Z1231 Encounter for screening mammogram for malignant neoplasm of breast: Secondary | ICD-10-CM | POA: Diagnosis not present

## 2017-07-07 DIAGNOSIS — H5211 Myopia, right eye: Secondary | ICD-10-CM | POA: Diagnosis not present

## 2017-07-07 DIAGNOSIS — Z961 Presence of intraocular lens: Secondary | ICD-10-CM | POA: Diagnosis not present

## 2017-07-13 ENCOUNTER — Encounter (INDEPENDENT_AMBULATORY_CARE_PROVIDER_SITE_OTHER): Payer: Self-pay | Admitting: Orthopedic Surgery

## 2017-07-13 ENCOUNTER — Ambulatory Visit (INDEPENDENT_AMBULATORY_CARE_PROVIDER_SITE_OTHER): Payer: Medicare Other | Admitting: Orthopedic Surgery

## 2017-07-13 DIAGNOSIS — M79644 Pain in right finger(s): Secondary | ICD-10-CM

## 2017-07-13 NOTE — Progress Notes (Signed)
Office Visit Note   Patient: Sharon Bauer           Date of Birth: December 12, 1945           MRN: [id number] Visit Date: 07/13/2017 Requested by: Prince Solian, MD [address] [place], [place] [address] PCP: Prince Solian, MD  Subjective: Chief Complaint  Patient presents with  . Hand Pain    right index finger red,swelling    HPI: Sharon Bauer is a 71 year old patient with right index finger pain.  Denies any history of injury.  She states she jammed it about a year ago.  Noticed some pain and swelling and redness on the ulnar aspect of the PIP joint about 3 weeks ago.  No family history or personal history of gout or pseudogout.  She reports a tightness in her finger with motion.  She is right-hand dominant.  She is not having any fevers or chills.  His heart for her to pour things and hard for her to hold things but she can still right.  She's not taking any medication yet.              ROS: All systems reviewed are negative as they relate to the chief complaint within the history of present illness.  Patient denies  fevers or chills.   Assessment & Plan: Visit Diagnoses: No diagnosis found.  Plan: Impression is right index finger arthritis with mild inflammation on that ulnar aspect of the PIP joint.  Ultrasound examination confirmed some swelling within the joint as well as spurring and arthritic change.  This is consistent with a 15 loss of flexion at the PIP joint compared to the left-hand side.  I would like to try her with some anti-inflammatory topical samples.  We'll see how that works in see her back as needed  Follow-Up Instructions: Return if symptoms worsen or fail to improve.   Orders:  No orders of the defined types were placed in this encounter.  No orders of the defined types were placed in this encounter.     Procedures: No procedures performed   Clinical Data: No additional findings.  Objective: Vital Signs: There were no vitals taken for this  visit.  Physical Exam:   Constitutional: Patient appears well-developed HEENT:  Head: Normocephalic Eyes:EOM are normal Neck: Normal range of motion Cardiovascular: Normal rate Pulmonary/chest: Effort normal Neurologic: Patient is alert Skin: Skin is warm Psychiatric: Patient has normal mood and affect    Ortho Exam: Orthopedic exam demonstrates some swelling in that right index finger compared to the left.  There is a little bit of tenderness on that ulnar aspect of the PIP joint.  She has 90 of flexion on the right at the PIP joint compared to about 105 on the left.  She has full extension and good collateral ligament stability at the PIP joint on the right-hand side.  Extensor mechanism is intact in the index finger FDS and FDP function also intact on the right index finger.  Specialty Comments:  No specialty comments available.  Imaging: No results found.   PMFS History: Patient Active Problem List   Diagnosis Date Noted  . Cervical lymphadenopathy 02/26/2017  . Sore throat 02/26/2017  . Muscle ache 02/25/2017  . Pain of right heel 12/15/2016  . Pain in left finger(s) 12/15/2016  . Dizziness 06/07/2015  . Rhinitis 03/17/2011  . 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 VITAMIN D DEFICIENCY 04/22/2009  . REACTIVE AIRWAY DISEASE 09/24/2008  . OSTEOPENIA 08/12/2008  .  FASTING HYPERGLYCEMIA 08/12/2008  . NONSPEC ELEVATION OF LEVELS OF TRANSAMINASE/LDH 10/05/2007  . Hyperlipidemia 10/02/2007  . Essential hypertension 04/26/2007   Past Medical History:  Diagnosis Date  . Depression    situational  . DEPRESSION 04/26/2007   Qualifier: Diagnosis of  By: Janelle Floor    . Elevated LFTs   . Hyperlipidemia   . Hypertension   . Osteopenia   . Rosacea     Family History  Problem Relation Age of Onset  . Heart attack Father        38s  . Heart failure Father   . COPD Father   . Hypertension Father   . Emphysema Father   . Heart disease Father 13       MI, CHF,  .  Hypertension Mother   . Hyperlipidemia Mother   . Osteoporosis Mother   . Osteoarthritis Mother   . Heart attack Maternal Uncle 55  . Heart disease Maternal Uncle 22       MI  . Heart attack Maternal Aunt 60  . Mental illness Maternal Aunt        ALZHEIMERS  . Heart attack Maternal Uncle 65  . Heart disease Maternal Uncle 6       MI  . Diabetes Maternal Grandfather   . Asthma Paternal Aunt   . Asthma Paternal Aunt   . Alzheimer's disease Maternal Aunt   . Heart disease Maternal Aunt 60       MI  . Hyperlipidemia Sister   . Colon cancer Paternal Grandmother   . Cancer Paternal Grandfather        COLON    Past Surgical History:  Procedure Laterality Date  . ABDOMINAL HYSTERECTOMY  1982   due to fibroids  . ABDOMINOPLASTY  1994  . CATARACT EXTRACTION, BILATERAL  2014  . CESAREAN SECTION    . COLONOSCOPY    . NASAL SEPTUM SURGERY    . TONSILLECTOMY  1960   Social History   Occupational History  . Not on file.   Social History Main Topics  . Smoking status: Never Smoker  . Smokeless tobacco: Never Used  . Alcohol use No     Comment: rare  . Drug use: No  . Sexual activity: Not on file

## 2017-08-23 DIAGNOSIS — L718 Other rosacea: Secondary | ICD-10-CM | POA: Diagnosis not present

## 2017-09-14 ENCOUNTER — Encounter: Payer: Self-pay | Admitting: Physician Assistant

## 2017-09-14 ENCOUNTER — Other Ambulatory Visit: Payer: Self-pay | Admitting: Internal Medicine

## 2017-09-14 DIAGNOSIS — Z6830 Body mass index (BMI) 30.0-30.9, adult: Secondary | ICD-10-CM | POA: Diagnosis not present

## 2017-09-14 DIAGNOSIS — R1032 Left lower quadrant pain: Secondary | ICD-10-CM

## 2017-09-19 ENCOUNTER — Ambulatory Visit
Admission: RE | Admit: 2017-09-19 | Discharge: 2017-09-19 | Disposition: A | Discharge status: Home / Self Care | Payer: Medicare Other | Source: Ambulatory Visit | Attending: Internal Medicine | Admitting: Internal Medicine

## 2017-09-19 DIAGNOSIS — K5730 Diverticulosis of large intestine without perforation or abscess without bleeding: Secondary | ICD-10-CM | POA: Diagnosis not present

## 2017-09-19 DIAGNOSIS — R1032 Left lower quadrant pain: Secondary | ICD-10-CM

## 2017-09-19 MED ORDER — IOPAMIDOL (ISOVUE-300) INJECTION 61%
100.0000 mL | Freq: Once | INTRAVENOUS | Status: AC | PRN
  Administered 2017-09-19: 100 mL via INTRAVENOUS

## 2017-09-21 DIAGNOSIS — F4321 Adjustment disorder with depressed mood: Secondary | ICD-10-CM | POA: Diagnosis not present

## 2017-09-21 DIAGNOSIS — Z23 Encounter for immunization: Secondary | ICD-10-CM | POA: Diagnosis not present

## 2017-09-21 DIAGNOSIS — R1032 Left lower quadrant pain: Secondary | ICD-10-CM | POA: Diagnosis not present

## 2017-09-21 DIAGNOSIS — E7849 Other hyperlipidemia: Secondary | ICD-10-CM | POA: Diagnosis not present

## 2017-09-21 DIAGNOSIS — Z6831 Body mass index (BMI) 31.0-31.9, adult: Secondary | ICD-10-CM | POA: Diagnosis not present

## 2017-09-22 ENCOUNTER — Encounter: Payer: Self-pay | Admitting: Physician Assistant

## 2017-09-22 ENCOUNTER — Ambulatory Visit (INDEPENDENT_AMBULATORY_CARE_PROVIDER_SITE_OTHER): Payer: Medicare Other | Admitting: Physician Assistant

## 2017-09-22 VITALS — BP 130/66 | HR 69 | Ht 65.0 in | Wt 183.0 lb

## 2017-09-22 DIAGNOSIS — R1032 Left lower quadrant pain: Secondary | ICD-10-CM | POA: Diagnosis not present

## 2017-09-22 DIAGNOSIS — K5731 Diverticulosis of large intestine without perforation or abscess with bleeding: Secondary | ICD-10-CM | POA: Diagnosis not present

## 2017-09-22 MED ORDER — NA SULFATE-K SULFATE-MG SULF 17.5-3.13-1.6 GM/177ML PO SOLN: ORAL | 0 refills | Status: AC

## 2017-09-22 NOTE — Patient Instructions (Signed)
Take Benefiber one dose daily in 8 oz of water. High fiber diet. Drink plenty of water daily.  You have been scheduled for a colonoscopy. Please follow written instructions given to you at your visit today.  Please pick up your prep supplies at the pharmacy within the next 1-3 days.[place]. If you use inhalers (even only as needed), please bring them with you on the day of your procedure. Your physician has requested that you go to www.startemmi.com and enter the access code given to you at your visit today. This web site gives a general overview about your procedure. However, you should still follow specific instructions given to you by our office regarding your preparation for the procedure.

## 2017-09-22 NOTE — Progress Notes (Signed)
Subjective:    Patient ID: Sharon Bauer, female    DOB: 02-22-1946, 71 y.o.   MRN: [id number]  HPI [name] is a 71 year old white female, known remotely to Dr. Delfin Edis who is referred today by Dr.  Dagmar Hait for evaluation of left lower quadrant pain. Patient was last seen here in August 2012 when she had colonoscopy with Dr. Olevia Perches which showed moderate diverticulosis throughout the colon, there were no polyps, and she was recommended to have 10 year interval follow-up. Patient has history of reactive airway disease, hypertension, hyperlipidemia and osteopenia. She says she started noticing a nagging discomfort in her left lower quadrant 3 or 4 weeks ago perhaps longer. She says she is not the most self aware of person. The pain has not progressed but has persisted and seems to come and go. She describes it as a mild aching. At times this seems to be exacerbated by eating, no change with bowel movements. Her bowel movements have been normal without melena or hematochezia. Appetite has been fine, weight stable. She is unaware of any recent injury. She had labs done through Dr. Dagmar Hait 's office 09/14/2017 chemistries within normal limits, WBC 8.6, hemoglobin 14.8, hematocrit of 45.5 MCV of 90.  CT of the abdomen and pelvis 09/19/2017 showed mild fatty infiltration of the liver , no  gallstones. Diverticuli most notable in the sigmoid colon with associated muscular hypertrophy, portions of the colon were underdistended limiting evaluation for detection of mass, however no extra luminal inflammatory process noted, calcified plaque in the aorta and aortic branch vessels, no large vessel occlusion. Patient is status post hysterectomy. Family history positive for paternal grandmother with colon cancer diagnosed in her 57s or 63s  Review of Systems Pertinent positive and negative review of systems were noted in the above HPI section.  All other review of systems was otherwise negative.  Outpatient  Encounter Prescriptions as of 09/22/2017  Medication Sig  . b complex vitamins tablet Take 1 tablet by mouth daily.    . cetirizine (ZYRTEC) 10 MG tablet Take 1 tablet (10 mg total) by mouth daily.  . fluticasone (FLONASE) 50 MCG/ACT nasal spray 1 spray in each nostril twice a day as needed. Use the "crossover" technique as discussed  . UNABLE TO FIND daily. Vitamin D with K2  . Na Sulfate-K Sulfate-Mg Sulf 17.5-3.13-1.6 GM/177ML SOLN Take as directed for colonoscopy.   No facility-administered encounter medications on file as of 09/22/2017.    Allergies  Allergen Reactions  . Nizatidine Hives and Itching   Patient Active Problem List   Diagnosis Date Noted  . Cervical lymphadenopathy 02/26/2017  . Sore throat 02/26/2017  . Muscle ache 02/25/2017  . Pain of right heel 12/15/2016  . Pain in left finger(s) 12/15/2016  . Dizziness 06/07/2015  . Rhinitis 03/17/2011  . 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 VITAMIN D DEFICIENCY 04/22/2009  . REACTIVE AIRWAY DISEASE 09/24/2008  . OSTEOPENIA 08/12/2008  . FASTING HYPERGLYCEMIA 08/12/2008  . NONSPEC ELEVATION OF LEVELS OF TRANSAMINASE/LDH 10/05/2007  . Hyperlipidemia 10/02/2007  . Essential hypertension 04/26/2007   Social History   Social History  . Marital status: Married    Spouse name: N/A  . Number of children: N/A  . Years of education: N/A   Occupational History  . Not on file.   Social History Main Topics  . Smoking status: Never Smoker  . Smokeless tobacco: Never Used  . Alcohol use No     Comment: rare  . Drug use:  No  . Sexual activity: Not on file   Other Topics Concern  . Not on file   Social History Narrative   Exercise - none regularly, typically walks regularly    Ms. Belluomini's family history includes Alzheimer's disease in her maternal aunt; Asthma in her paternal aunt and paternal aunt; COPD in her father; Cancer in her paternal grandfather; Colon cancer in her paternal grandmother; Diabetes in her  maternal grandfather; Emphysema in her father; Heart attack in her father; Heart attack (age of onset: 35) in her maternal uncle; Heart attack (age of onset: 35) in her maternal aunt; Heart attack (age of onset: 63) in her maternal uncle; Heart disease (age of onset: 64) in her maternal uncle; Heart disease (age of onset: 24) in her maternal aunt; Heart disease (age of onset: 69) in her maternal uncle; Heart disease (age of onset: 38) in her father; Heart failure in her father; Hyperlipidemia in her mother and sister; Hypertension in her father and mother; Mental illness in her maternal aunt; Osteoarthritis in her mother; Osteoporosis in her mother.      Objective:    Vitals:   09/22/17 0959  BP: 130/66  Pulse: 69    Physical Exam well-developed older white female in no acute distress, blood pressure 130/66, pulse 69, BMI 30.4. HEENT; nontraumatic normocephalic EOMI PERRLA sclera anicteric, Cardiovascular; regular rate and rhythm with S1-S2 no murmur rub or gallop, Pulmonary; clear bilaterally, Abdomen; soft, bowel sounds are present. Some very mild tenderness to deep palpation in the left lower quadrant no guarding or rebound no palpable mass or hepatosplenomegaly she has a large lower abdominal transverse incisional scar from previous abdominoplasty, Rectal ;exam not done, Extremities ;no clubbing cyanosis or edema skin warm and dry, Neuropsych; mood and affect appropriate       Assessment & Plan:   #64 71 year old white female with several week history of intermittent left lower quadrant discomfort. CT shows sigmoid diverticulosis with associated muscular hypertrophy, no acute diverticulitis. Portions of the colon were under distended, somewhat limiting evaluation.  Suspect symptoms may be secondary to symptomatic diverticulosis says she seems to have significant muscular hypertrophy, rule out occult colon lesion  #2 mild hepatic steatosis #3 family history of colon cancer in patient's  paternal grandmother at young age #4 reactive airway disease #5 hypertension #Hyperlipidemia  Plan; long discussion with the patient today regarding diverticulosis and diverticulitis and management of diverticular disease. She will continue high-fiber diet, add Benefiber one dose daily and a glass of water, avoid nuts and popcorn Offered a trial of an antispasmodic which she declined Patient will be scheduled for colonoscopy with Dr. Silverio Decamp. Procedure discussed in detail with patient including risks and benefits and she is agreeable to proceed.   Meloney Feld S Tana Trefry PA-C 09/22/2017   Cc: Prince Solian, MD

## 2017-09-23 NOTE — Progress Notes (Signed)
Reviewed and agree with documentation and assessment and plan. K. Veena Ewa Hipp , MD   

## 2017-10-28 ENCOUNTER — Encounter: Payer: Medicare Other | Admitting: Gastroenterology

## 2018-01-12 ENCOUNTER — Ambulatory Visit (INDEPENDENT_AMBULATORY_CARE_PROVIDER_SITE_OTHER): Payer: Medicare Other | Admitting: Sports Medicine

## 2018-01-12 VITALS — BP 132/86 | Ht 65.0 in | Wt 170.0 lb

## 2018-01-12 DIAGNOSIS — M79671 Pain in right foot: Secondary | ICD-10-CM

## 2018-01-13 ENCOUNTER — Encounter: Payer: Self-pay | Admitting: Sports Medicine

## 2018-01-13 NOTE — Progress Notes (Signed)
   Subjective:    Patient ID: Sharon Bauer, female    DOB: [birth year], 72 y.o.   MRN: [id number]  HPI chief complaint: Right heel pain  Very pleasant 72 year old female comes in today requesting custom orthotics. She has a history of a right heel spur which has been present for several months. She has off-the-shelf inserts but she does not think they provide her with enough support. She localizes her pain to the posterior aspect of the calcaneus. Worse with activity. Improves at rest. No trauma. No swelling. No numbness or tingling.  Past medical history reviewed Medications reviewed Allergies reviewed    Review of Systems    as above Objective:   Physical Exam  Well-developed, well-nourished. No acute distress. Awake alert and oriented 3. Vital signs reviewed  Right foot: There is slight tenderness to palpation at the insertion of the Achilles tendon onto the calcaneus. No soft tissue swelling noted. No thickening of the tendon. Slight pain with Achilles stretch. No pain with calcaneal squeeze. Pes planus with standing and pronation with walking. No limp. Good pulses. Sensation is intact to light touch grossly.      Assessment & Plan:  Pes planus Pronation History of calcaneal traction spur  Patient would like to buy a new pair of tennis shoes before we construct custom orthotics. Her current pair is a little too big. She will return to the office at her convenience with her new tennis shoes at which point we will plan on making her custom orthotics.

## 2018-01-30 ENCOUNTER — Ambulatory Visit (INDEPENDENT_AMBULATORY_CARE_PROVIDER_SITE_OTHER): Payer: Medicare Other | Admitting: Sports Medicine

## 2018-01-30 VITALS — BP 128/82 | Ht 65.0 in | Wt 170.0 lb

## 2018-01-30 DIAGNOSIS — M79671 Pain in right foot: Secondary | ICD-10-CM

## 2018-01-30 NOTE — Progress Notes (Signed)
Patient comes in today for custom orthotics. Please see the office note from February 21 for details regarding history and physical exam findings.  Custom orthotics were created for this patient today. She found them to be comfortable prior to leaving the office. Gait was neutral with orthotics in place. Total time spent with the patient was 30 minutes with greater than 50% of the time spent in face-to-face consultation discussing orthotic construction, instruction, and fitting. Follow-up as needed.  Patient was fitted for a : standard, cushioned, semi-rigid orthotic. The orthotic was heated and afterward the patient stood on the orthotic blank positioned on the orthotic stand. The patient was positioned in subtalar neutral position and 10 degrees of ankle dorsiflexion in a weight bearing stance. After completion of molding, a stable base was applied to the orthotic blank. The blank was ground to a stable position for weight bearing. Size: 6 Base: Blue EVA Posting: none Additional orthotic padding: none

## 2018-02-06 ENCOUNTER — Encounter: Payer: Medicare Other | Admitting: Sports Medicine

## 2018-04-11 DIAGNOSIS — X32XXXD Exposure to sunlight, subsequent encounter: Secondary | ICD-10-CM | POA: Diagnosis not present

## 2018-04-11 DIAGNOSIS — L570 Actinic keratosis: Secondary | ICD-10-CM | POA: Diagnosis not present

## 2018-04-11 DIAGNOSIS — L820 Inflamed seborrheic keratosis: Secondary | ICD-10-CM | POA: Diagnosis not present

## 2018-04-21 DIAGNOSIS — E7849 Other hyperlipidemia: Secondary | ICD-10-CM | POA: Diagnosis not present

## 2018-04-21 DIAGNOSIS — M859 Disorder of bone density and structure, unspecified: Secondary | ICD-10-CM | POA: Diagnosis not present

## 2018-04-21 DIAGNOSIS — R82998 Other abnormal findings in urine: Secondary | ICD-10-CM | POA: Diagnosis not present

## 2018-04-25 DIAGNOSIS — Z1212 Encounter for screening for malignant neoplasm of rectum: Secondary | ICD-10-CM | POA: Diagnosis not present

## 2018-04-26 DIAGNOSIS — Z6830 Body mass index (BMI) 30.0-30.9, adult: Secondary | ICD-10-CM | POA: Diagnosis not present

## 2018-04-26 DIAGNOSIS — R1032 Left lower quadrant pain: Secondary | ICD-10-CM | POA: Diagnosis not present

## 2018-04-26 DIAGNOSIS — Z1389 Encounter for screening for other disorder: Secondary | ICD-10-CM | POA: Diagnosis not present

## 2018-04-26 DIAGNOSIS — Z0131 Encounter for examination of blood pressure with abnormal findings: Secondary | ICD-10-CM | POA: Diagnosis not present

## 2018-04-26 DIAGNOSIS — E7849 Other hyperlipidemia: Secondary | ICD-10-CM | POA: Diagnosis not present

## 2018-04-26 DIAGNOSIS — Z0000 Encounter for general adult medical examination without abnormal findings: Secondary | ICD-10-CM | POA: Diagnosis not present

## 2018-04-26 DIAGNOSIS — M859 Disorder of bone density and structure, unspecified: Secondary | ICD-10-CM | POA: Diagnosis not present

## 2018-05-10 DIAGNOSIS — Z1211 Encounter for screening for malignant neoplasm of colon: Secondary | ICD-10-CM | POA: Diagnosis not present

## 2018-05-10 DIAGNOSIS — Z1212 Encounter for screening for malignant neoplasm of rectum: Secondary | ICD-10-CM | POA: Diagnosis not present

## 2018-06-19 ENCOUNTER — Other Ambulatory Visit: Payer: Self-pay | Admitting: Internal Medicine

## 2018-06-19 DIAGNOSIS — Z1231 Encounter for screening mammogram for malignant neoplasm of breast: Secondary | ICD-10-CM

## 2018-07-13 ENCOUNTER — Ambulatory Visit
Admission: RE | Admit: 2018-07-13 | Discharge: 2018-07-13 | Disposition: A | Discharge status: Home / Self Care | Payer: Medicare Other | Source: Ambulatory Visit | Attending: Internal Medicine | Admitting: Internal Medicine

## 2018-07-13 DIAGNOSIS — Z1231 Encounter for screening mammogram for malignant neoplasm of breast: Secondary | ICD-10-CM

## 2018-08-14 DIAGNOSIS — H6121 Impacted cerumen, right ear: Secondary | ICD-10-CM | POA: Diagnosis not present

## 2018-08-14 DIAGNOSIS — H60549 Acute eczematoid otitis externa, unspecified ear: Secondary | ICD-10-CM | POA: Diagnosis not present

## 2018-09-07 ENCOUNTER — Other Ambulatory Visit: Payer: Self-pay | Admitting: Internal Medicine

## 2018-09-07 DIAGNOSIS — M8580 Other specified disorders of bone density and structure, unspecified site: Secondary | ICD-10-CM

## 2018-09-14 DIAGNOSIS — E7849 Other hyperlipidemia: Secondary | ICD-10-CM | POA: Diagnosis not present

## 2018-09-14 DIAGNOSIS — Z6830 Body mass index (BMI) 30.0-30.9, adult: Secondary | ICD-10-CM | POA: Diagnosis not present

## 2018-09-15 DIAGNOSIS — E7849 Other hyperlipidemia: Secondary | ICD-10-CM | POA: Diagnosis not present

## 2018-09-15 DIAGNOSIS — N3281 Overactive bladder: Secondary | ICD-10-CM | POA: Diagnosis not present

## 2018-09-15 DIAGNOSIS — Z0131 Encounter for examination of blood pressure with abnormal findings: Secondary | ICD-10-CM | POA: Diagnosis not present

## 2018-09-15 DIAGNOSIS — Z6830 Body mass index (BMI) 30.0-30.9, adult: Secondary | ICD-10-CM | POA: Diagnosis not present

## 2018-09-15 DIAGNOSIS — M859 Disorder of bone density and structure, unspecified: Secondary | ICD-10-CM | POA: Diagnosis not present

## 2018-09-22 ENCOUNTER — Ambulatory Visit
Admission: RE | Admit: 2018-09-22 | Discharge: 2018-09-22 | Disposition: A | Discharge status: Home / Self Care | Payer: Medicare Other | Source: Ambulatory Visit | Attending: Internal Medicine | Admitting: Internal Medicine

## 2018-09-22 DIAGNOSIS — M8580 Other specified disorders of bone density and structure, unspecified site: Secondary | ICD-10-CM

## 2018-09-22 DIAGNOSIS — M8589 Other specified disorders of bone density and structure, multiple sites: Secondary | ICD-10-CM | POA: Diagnosis not present

## 2018-09-22 DIAGNOSIS — Z780 Asymptomatic menopausal state: Secondary | ICD-10-CM | POA: Diagnosis not present

## 2018-09-29 DIAGNOSIS — Z961 Presence of intraocular lens: Secondary | ICD-10-CM | POA: Diagnosis not present

## 2018-09-29 DIAGNOSIS — H04123 Dry eye syndrome of bilateral lacrimal glands: Secondary | ICD-10-CM | POA: Diagnosis not present

## 2018-09-29 DIAGNOSIS — H5211 Myopia, right eye: Secondary | ICD-10-CM | POA: Diagnosis not present

## 2018-10-25 DIAGNOSIS — Z6830 Body mass index (BMI) 30.0-30.9, adult: Secondary | ICD-10-CM | POA: Diagnosis not present

## 2018-10-25 DIAGNOSIS — J309 Allergic rhinitis, unspecified: Secondary | ICD-10-CM | POA: Diagnosis not present

## 2018-10-25 DIAGNOSIS — J209 Acute bronchitis, unspecified: Secondary | ICD-10-CM | POA: Diagnosis not present

## 2018-12-12 DIAGNOSIS — R062 Wheezing: Secondary | ICD-10-CM | POA: Diagnosis not present

## 2018-12-12 DIAGNOSIS — J45909 Unspecified asthma, uncomplicated: Secondary | ICD-10-CM | POA: Diagnosis not present

## 2018-12-12 DIAGNOSIS — J309 Allergic rhinitis, unspecified: Secondary | ICD-10-CM | POA: Diagnosis not present

## 2018-12-28 DIAGNOSIS — J309 Allergic rhinitis, unspecified: Secondary | ICD-10-CM | POA: Diagnosis not present

## 2018-12-28 DIAGNOSIS — J189 Pneumonia, unspecified organism: Secondary | ICD-10-CM | POA: Diagnosis not present

## 2018-12-28 DIAGNOSIS — Z6828 Body mass index (BMI) 28.0-28.9, adult: Secondary | ICD-10-CM | POA: Diagnosis not present

## 2019-01-04 DIAGNOSIS — J189 Pneumonia, unspecified organism: Secondary | ICD-10-CM | POA: Diagnosis not present

## 2019-01-04 DIAGNOSIS — Z6828 Body mass index (BMI) 28.0-28.9, adult: Secondary | ICD-10-CM | POA: Diagnosis not present

## 2019-01-11 DIAGNOSIS — J189 Pneumonia, unspecified organism: Secondary | ICD-10-CM | POA: Diagnosis not present

## 2019-01-16 DIAGNOSIS — J189 Pneumonia, unspecified organism: Secondary | ICD-10-CM | POA: Diagnosis not present

## 2019-01-16 DIAGNOSIS — Z1331 Encounter for screening for depression: Secondary | ICD-10-CM | POA: Diagnosis not present

## 2019-01-16 DIAGNOSIS — Z6827 Body mass index (BMI) 27.0-27.9, adult: Secondary | ICD-10-CM | POA: Diagnosis not present

## 2019-01-16 DIAGNOSIS — Z0131 Encounter for examination of blood pressure with abnormal findings: Secondary | ICD-10-CM | POA: Diagnosis not present

## 2019-02-01 DIAGNOSIS — H0012 Chalazion right lower eyelid: Secondary | ICD-10-CM | POA: Diagnosis not present

## 2019-05-23 DIAGNOSIS — L298 Other pruritus: Secondary | ICD-10-CM | POA: Diagnosis not present

## 2019-05-23 DIAGNOSIS — L820 Inflamed seborrheic keratosis: Secondary | ICD-10-CM | POA: Diagnosis not present

## 2019-05-23 DIAGNOSIS — Z1283 Encounter for screening for malignant neoplasm of skin: Secondary | ICD-10-CM | POA: Diagnosis not present

## 2019-05-23 DIAGNOSIS — L650 Telogen effluvium: Secondary | ICD-10-CM | POA: Diagnosis not present

## 2019-09-17 ENCOUNTER — Other Ambulatory Visit: Payer: Self-pay | Admitting: Surgical

## 2019-09-17 ENCOUNTER — Telehealth: Payer: Self-pay | Admitting: Surgical

## 2019-09-17 MED ORDER — CELECOXIB 200 MG PO CAPS: 200.0000 mg | ORAL_CAPSULE | Freq: Two times a day (BID) | ORAL | 0 refills | Status: DC

## 2019-09-17 NOTE — Telephone Encounter (Signed)
Called and discussed patient's injury.  Injured wrist bracing herself from fall yesterday.  Initially was not very painful but has been worsening over last day.  Able to use for ADLs somewhat but limited due to pain.  Taking Ibuprofen with some relief.  Prescribed Celebrex for pain until she can be seen by the office.  Recommended RICE.  Patient will call the office on Wednesday to be worked in and seen as the office is closed Monday/Tuesday.  If her pain continues to worsen, encouraged patient to go to ED/Urgent care.  Patient understands and agrees with plan.

## 2019-09-21 ENCOUNTER — Ambulatory Visit (INDEPENDENT_AMBULATORY_CARE_PROVIDER_SITE_OTHER): Payer: Medicare Other | Admitting: Orthopedic Surgery

## 2019-09-21 ENCOUNTER — Other Ambulatory Visit: Payer: Self-pay

## 2019-09-21 ENCOUNTER — Encounter: Payer: Self-pay | Admitting: Orthopedic Surgery

## 2019-09-21 ENCOUNTER — Ambulatory Visit (INDEPENDENT_AMBULATORY_CARE_PROVIDER_SITE_OTHER): Payer: Medicare Other

## 2019-09-21 DIAGNOSIS — M25532 Pain in left wrist: Secondary | ICD-10-CM | POA: Diagnosis not present

## 2019-09-21 NOTE — Progress Notes (Signed)
Office Visit Note   Patient: Sharon Bauer           Date of Birth: 1946/07/10           MRN: [id number] Visit Date: 09/21/2019 Requested by: Prince Solian, MD [place],  [place] [address] PCP: Prince Solian, MD  Subjective: No chief complaint on file.   HPI: Vincenza is a 73 year old patient right-hand-dominant who fell on her nondominant left wrist 4 days ago.  Reports mild pain but in general she has been functional.  Has a little bit of pain localizing to the dorsal ulnar aspect.  Denies much in the way of new mechanical symptoms in the wrist.  She has been using a wrap which is giving her some relief.  Not taking too much in terms of medication.              ROS: All systems reviewed are negative as they relate to the chief complaint within the history of present illness.  Patient denies  fevers or chills.   Assessment & Plan: Visit Diagnoses:  1. Pain in left wrist     Plan: Impression is left wrist pain following fall on outstretched hand.  Most of her pain is dorsal ulnar.  No definite fracture is seen but she does have a little bit of bruising on the dorsal aspect of the hand.  I think she may have an occult fracture which is not really visible.  Nonetheless she is very functional with great range of motion and good grip strength.  Plan at this time is compression and no lifting more than 2 pounds for the next 2 to 3 weeks.  She should continue to improve but if not she should come back for further imaging and possible immobilization.  Follow-Up Instructions: Return if symptoms worsen or fail to improve.   Orders:  Orders Placed This Encounter  Procedures  . XR Wrist Complete Left   No orders of the defined types were placed in this encounter.     Procedures: No procedures performed   Clinical Data: No additional findings.  Objective: Vital Signs: There were no vitals taken for this visit.  Physical Exam:   Constitutional: Patient appears  well-developed HEENT:  Head: Normocephalic Eyes:EOM are normal Neck: Normal range of motion Cardiovascular: Normal rate Pulmonary/chest: Effort normal Neurologic: Patient is alert Skin: Skin is warm Psychiatric: Patient has normal mood and affect    Ortho Exam: Ortho exam demonstrates 2 x 2 centimeter area of mild ecchymosis dorsally.  She has 5 out of 5 grip strength bilaterally with palpable intact nontender FCR ECU and FCU tendons.  Mild tenderness over the ulnar styloid.  No real crepitus with radial ulnar deviation of that left wrist.  Radial pulses intact.  No snuffbox tenderness.  Specialty Comments:  No specialty comments available.  Imaging: Xr Wrist Complete Left  Result Date: 09/21/2019 AP lateral oblique left wrist reviewed.  No acute fracture is present.  Mild degenerative changes noted in the intercarpal region.  Scapholunate angle within normal limits.    PMFS History: Patient Active Problem List   Diagnosis Date Noted  . Cervical lymphadenopathy 02/26/2017  . Sore throat 02/26/2017  . Muscle ache 02/25/2017  . Pain of right heel 12/15/2016  . Pain in left finger(s) 12/15/2016  . Dizziness 06/07/2015  . Rhinitis 03/17/2011  . 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 VITAMIN D DEFICIENCY 04/22/2009  . REACTIVE AIRWAY DISEASE 09/24/2008  . OSTEOPENIA 08/12/2008  .  FASTING HYPERGLYCEMIA 08/12/2008  . NONSPEC ELEVATION OF LEVELS OF TRANSAMINASE/LDH 10/05/2007  . Hyperlipidemia 10/02/2007  . Essential hypertension 04/26/2007   Past Medical History:  Diagnosis Date  . Depression    situational  . DEPRESSION 04/26/2007   Qualifier: Diagnosis of  By: Janelle Floor    . Elevated LFTs   . Hyperlipidemia   . Hypertension   . Osteopenia   . Rosacea     Family History  Problem Relation Age of Onset  . Heart attack Father        8s  . Heart failure Father   . COPD Father   . Hypertension Father   . Emphysema Father   . Heart disease Father 6       MI,  CHF,  . Hypertension Mother   . Hyperlipidemia Mother   . Osteoporosis Mother   . Osteoarthritis Mother   . Heart attack Maternal Uncle 55  . Heart disease Maternal Uncle 51       MI  . Heart attack Maternal Aunt 60  . Mental illness Maternal Aunt        AL[place]HEIMERS  . Heart attack Maternal Uncle 65  . Heart disease Maternal Uncle 57       MI  . Diabetes Maternal Grandfather   . Asthma Paternal Aunt   . Asthma Paternal Aunt   . Alzheimer's disease Maternal Aunt   . Heart disease Maternal Aunt 60       MI  . Hyperlipidemia Sister   . Colon cancer Paternal Grandmother   . Cancer Paternal Grandfather        COLON    Past Surgical History:  Procedure Laterality Date  . ABDOMINAL HYSTERECTOMY  1982   due to fibroids  . ABDOMINOPLASTY  1994  . CATARACT EXTRACTION, BILATERAL  2014  . CESAREAN SECTION    . COLONOSCOPY    . NASAL SEPTUM SURGERY    . TONSILLECTOMY  1960   Social History   Occupational History  . Not on file  Tobacco Use  . Smoking status: Never Smoker  . Smokeless tobacco: Never Used  Substance and Sexual Activity  . Alcohol use: No    Comment: rare  . Drug use: No  . Sexual activity: Not on file

## 2019-10-02 DIAGNOSIS — H26492 Other secondary cataract, left eye: Secondary | ICD-10-CM | POA: Diagnosis not present

## 2019-10-02 DIAGNOSIS — H524 Presbyopia: Secondary | ICD-10-CM | POA: Diagnosis not present

## 2020-01-22 DIAGNOSIS — H6123 Impacted cerumen, bilateral: Secondary | ICD-10-CM | POA: Diagnosis not present

## 2020-01-22 DIAGNOSIS — H60543 Acute eczematoid otitis externa, bilateral: Secondary | ICD-10-CM | POA: Diagnosis not present

## 2020-07-07 ENCOUNTER — Ambulatory Visit (INDEPENDENT_AMBULATORY_CARE_PROVIDER_SITE_OTHER): Payer: Medicare Other | Admitting: Orthopedic Surgery

## 2020-07-07 ENCOUNTER — Encounter: Payer: Self-pay | Admitting: Orthopedic Surgery

## 2020-07-07 ENCOUNTER — Ambulatory Visit (INDEPENDENT_AMBULATORY_CARE_PROVIDER_SITE_OTHER): Payer: Medicare Other

## 2020-07-07 VITALS — Ht 64.5 in | Wt 160.0 lb

## 2020-07-07 DIAGNOSIS — M79661 Pain in right lower leg: Secondary | ICD-10-CM | POA: Diagnosis not present

## 2020-07-10 DIAGNOSIS — H04123 Dry eye syndrome of bilateral lacrimal glands: Secondary | ICD-10-CM | POA: Diagnosis not present

## 2020-07-13 ENCOUNTER — Encounter: Payer: Self-pay | Admitting: Orthopedic Surgery

## 2020-07-13 NOTE — Progress Notes (Signed)
Office Visit Note   Patient: Sharon Bauer           Date of Birth: 1946/08/26           MRN: [id number] Visit Date: 07/07/2020 Requested by: Prince Solian, MD [address] [place],   [address] PCP: Prince Solian, MD  Subjective: Chief Complaint  Patient presents with  . left calf    swelling    HPI: Sharon Bauer is a 74 y.o. female who presents to the office complaining of right calf pain.  Patient notes that she has had right calf pain for several months.  She denies any known injury leading to onset of pain.  Her pain is worse if she does not take magnesium.  If she does not take magnesium she notes cramps in her calf.  She denies any history of blood clots.  Denies any history of surgery or fracture near the location of pain.  She describes her symptoms as more of a "discomfort" rather than pain.  She denies any limping.  She is not using any form of DME to help with ambulation.  She has not taken any medication for pain..                ROS: All systems reviewed are negative as they relate to the chief complaint within the history of present illness.  Patient denies fevers or chills.  Assessment & Plan: Visit Diagnoses:  1. Pain in right lower leg     Plan: Patient is a 74 year old female presents complaining of right calf pain.  She has had several months of discomfort.  Radiographs of the right tib-fib were taken today and found to be negative for any acute findings that could explain her pain.  No sign of DVT on exam.  She has no antalgic gait on exam.  Not taking any medication for pain.  Overall, no significant concerns regarding patient's discomfort and no significant findings are found on exam.  Recommended patient try over-the-counter medications such as anti-inflammatories and continue to take magnesium to see if pain resolves.  If no significant resolution of pain, patient may follow-up as needed in the future.  She does have degenerative changes of her  right knee medial compartment on radiographs but her description of pain does not seem to match up with radiating pain coming from knee arthritis.  However, this may be something to examine in the future if her pain does not resolve.  Follow-up as needed patient agreed with this plan.  Follow-Up Instructions: No follow-ups on file.   Orders:  Orders Placed This Encounter  Procedures  . XR Tibia/Fibula Right   No orders of the defined types were placed in this encounter.     Procedures: No procedures performed   Clinical Data: No additional findings.  Objective: Vital Signs: Ht 5' 4.5" (1.638 m)   Wt 160 lb (72.6 kg)   BMI 27.04 kg/m   Physical Exam:  Constitutional: Patient appears well-developed HEENT:  Head: Normocephalic Eyes:EOM are normal Neck: Normal range of motion Cardiovascular: Normal rate Pulmonary/chest: Effort normal Neurologic: Patient is alert Skin: Skin is warm Psychiatric: Patient has normal mood and affect  Ortho Exam: Ortho exam demonstrates right calf and shin without significant swelling compared with contralateral side.  No calf tenderness on exam.  Negative Homans' sign.  No tenderness along the anterior tibial shaft.  Compartments are soft and compressible.  Dorsiflexion and plantar flexion are intact of the right ankle.  No  tenderness to palpation over the medial/lateral malleoli.  Specialty Comments:  No specialty comments available.  Imaging: No results found.   PMFS History: Patient Active Problem List   Diagnosis Date Noted  . Cervical lymphadenopathy 02/26/2017  . Sore throat 02/26/2017  . Muscle ache 02/25/2017  . Pain of right heel 12/15/2016  . Pain in left finger(s) 12/15/2016  . Dizziness 06/07/2015  . Rhinitis 03/17/2011  . 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 VITAMIN D DEFICIENCY 04/22/2009  . REACTIVE AIRWAY DISEASE 09/24/2008  . OSTEOPENIA 08/12/2008  . FASTING HYPERGLYCEMIA 08/12/2008  . NONSPEC ELEVATION OF LEVELS  OF TRANSAMINASE/LDH 10/05/2007  . Hyperlipidemia 10/02/2007  . Essential hypertension 04/26/2007   Past Medical History:  Diagnosis Date  . Depression    situational  . DEPRESSION 04/26/2007   Qualifier: Diagnosis of  By: Janelle Floor    . Elevated LFTs   . Hyperlipidemia   . Hypertension   . Osteopenia   . Rosacea     Family History  Problem Relation Age of Onset  . Heart attack Father        69s  . Heart failure Father   . COPD Father   . Hypertension Father   . Emphysema Father   . Heart disease Father 9       MI, CHF,  . Hypertension Mother   . Hyperlipidemia Mother   . Osteoporosis Mother   . Osteoarthritis Mother   . Heart attack Maternal Uncle 55  . Heart disease Maternal Uncle 6       MI  . Heart attack Maternal Aunt 60  . Mental illness Maternal Aunt        [place]IM[place]  . Heart attack Maternal Uncle 65  . Heart disease Maternal Uncle 49       MI  . Diabetes Maternal Grandfather   . Asthma Paternal Aunt   . Asthma Paternal Aunt   . Alzheimer's disease Maternal Aunt   . Heart disease Maternal Aunt 60       MI  . Hyperlipidemia Sister   . Colon cancer Paternal Grandmother   . Cancer Paternal Grandfather        COLON    Past Surgical History:  Procedure Laterality Date  . ABDOMINAL HYSTERECTOMY  1982   due to fibroids  . ABDOMINOPLASTY  1994  . CATARACT EXTRACTION, BILATERAL  2014  . CESAREAN SECTION    . COLONOSCOPY    . NASAL SEPTUM SURGERY    . TONSILLECTOMY  1960   Social History   Occupational History  . Not on file  Tobacco Use  . Smoking status: Never Smoker  . Smokeless tobacco: Never Used  Substance and Sexual Activity  . Alcohol use: No    Comment: rare  . Drug use: No  . Sexual activity: Not on file

## 2020-07-16 DIAGNOSIS — L820 Inflamed seborrheic keratosis: Secondary | ICD-10-CM | POA: Diagnosis not present

## 2020-07-17 ENCOUNTER — Encounter: Payer: Self-pay | Admitting: Orthopedic Surgery

## 2020-07-22 DIAGNOSIS — H60543 Acute eczematoid otitis externa, bilateral: Secondary | ICD-10-CM | POA: Diagnosis not present

## 2020-07-22 DIAGNOSIS — H6123 Impacted cerumen, bilateral: Secondary | ICD-10-CM | POA: Diagnosis not present

## 2020-08-28 ENCOUNTER — Other Ambulatory Visit: Payer: Self-pay

## 2020-08-28 ENCOUNTER — Encounter: Payer: Self-pay | Admitting: Sports Medicine

## 2020-08-28 ENCOUNTER — Ambulatory Visit (INDEPENDENT_AMBULATORY_CARE_PROVIDER_SITE_OTHER): Payer: Medicare Other | Admitting: Sports Medicine

## 2020-08-28 VITALS — BP 155/82 | Ht 65.0 in | Wt 160.0 lb

## 2020-08-28 DIAGNOSIS — M549 Dorsalgia, unspecified: Secondary | ICD-10-CM | POA: Diagnosis not present

## 2020-08-28 NOTE — Progress Notes (Signed)
   Subjective:    Patient ID: Sharon Bauer, female    DOB: 1945/12/27, 74 y.o.   MRN: [id number]  HPI chief complaint: Right sided upper back pain  Very pleasant 74 year old female comes in today complaining of 2 weeks of right upper back pain that she localizes just medial to the right scapula.  She does not recall any specific trauma but she did begin to notice pain after sleeping on some sofa cushions at her daughter's house.  Pain was initially intermittent but it has become more constant.  She was seen at her PCPs office and chiropractic evaluation was recommended.  She saw a chiropractor for treatment but it was not helpful.  She has tried heat without any relief.  She took over-the-counter Aleve which she did find helpful.  Pain does not radiate down the arm.  No numbness or tingling.  No neck pain.  Past medical history reviewed Medications reviewed Allergies reviewed    Review of Systems As above    Objective:   Physical Exam  Well-developed, well-nourished.  No acute distress.  Awake alert and oriented x3.  Vital signs reviewed.  Cervical spine: Good cervical range of motion.  No tenderness to palpation along the cervical midline.  Right scapula: There is tenderness to palpation in the area of the rhomboid muscles medial to the scapula.  No spasm.  Good scapular mobility and full shoulder range of motion.  Grossly neurologically intact distally.      Assessment & Plan:   Right rhomboid strain versus referred pain from cervical spine  Recommended that the patient try tennis ball massage for the next few days as well as a scheduled dose of over-the-counter Aleve twice daily for the next 3 to 4 days.  She may also substitute turmeric or Boswellia.  I have given her a prescription for [facility] physical therapy.  She will wean to home exercise program per their discretion.  I would look for her symptoms to improve quickly over the next couple of weeks.  She will follow-up for  ongoing or recalcitrant issues.

## 2020-09-03 ENCOUNTER — Encounter: Payer: Self-pay | Admitting: Orthopedic Surgery

## 2020-09-03 NOTE — Telephone Encounter (Signed)
Regarding a second opinion I am happy to see her for whenever she wants to be seen for.  However those interventions seem fairly reasonable as a starting point but if they are not helping then moving to the next step is a reasonable option.

## 2020-09-05 ENCOUNTER — Other Ambulatory Visit: Payer: Self-pay | Admitting: Internal Medicine

## 2020-09-05 DIAGNOSIS — Z1231 Encounter for screening mammogram for malignant neoplasm of breast: Secondary | ICD-10-CM

## 2020-09-05 DIAGNOSIS — M546 Pain in thoracic spine: Secondary | ICD-10-CM

## 2020-09-08 ENCOUNTER — Other Ambulatory Visit: Payer: Self-pay

## 2020-09-08 ENCOUNTER — Ambulatory Visit
Admission: RE | Admit: 2020-09-08 | Discharge: 2020-09-08 | Disposition: A | Discharge status: Home / Self Care | Payer: Medicare Other | Source: Ambulatory Visit | Attending: Internal Medicine | Admitting: Internal Medicine

## 2020-09-08 DIAGNOSIS — Z1231 Encounter for screening mammogram for malignant neoplasm of breast: Secondary | ICD-10-CM | POA: Diagnosis not present

## 2020-09-10 ENCOUNTER — Ambulatory Visit: Payer: Medicare Other | Admitting: Orthopedic Surgery

## 2020-09-16 DIAGNOSIS — M542 Cervicalgia: Secondary | ICD-10-CM | POA: Diagnosis not present

## 2020-10-02 DIAGNOSIS — H26492 Other secondary cataract, left eye: Secondary | ICD-10-CM | POA: Diagnosis not present

## 2020-10-02 DIAGNOSIS — H04123 Dry eye syndrome of bilateral lacrimal glands: Secondary | ICD-10-CM | POA: Diagnosis not present

## 2020-10-22 DIAGNOSIS — H26492 Other secondary cataract, left eye: Secondary | ICD-10-CM | POA: Diagnosis not present

## 2020-10-29 DIAGNOSIS — L820 Inflamed seborrheic keratosis: Secondary | ICD-10-CM | POA: Diagnosis not present

## 2021-01-27 DIAGNOSIS — H9011 Conductive hearing loss, unilateral, right ear, with unrestricted hearing on the contralateral side: Secondary | ICD-10-CM | POA: Diagnosis not present

## 2021-01-27 DIAGNOSIS — H6121 Impacted cerumen, right ear: Secondary | ICD-10-CM | POA: Diagnosis not present

## 2021-05-12 DIAGNOSIS — L820 Inflamed seborrheic keratosis: Secondary | ICD-10-CM | POA: Diagnosis not present

## 2021-05-12 DIAGNOSIS — L908 Other atrophic disorders of skin: Secondary | ICD-10-CM | POA: Diagnosis not present

## 2021-07-14 DIAGNOSIS — Z6831 Body mass index (BMI) 31.0-31.9, adult: Secondary | ICD-10-CM | POA: Diagnosis not present

## 2021-07-14 DIAGNOSIS — Z124 Encounter for screening for malignant neoplasm of cervix: Secondary | ICD-10-CM | POA: Diagnosis not present

## 2021-07-14 DIAGNOSIS — N3941 Urge incontinence: Secondary | ICD-10-CM | POA: Diagnosis not present

## 2021-07-14 DIAGNOSIS — Z01419 Encounter for gynecological examination (general) (routine) without abnormal findings: Secondary | ICD-10-CM | POA: Diagnosis not present

## 2021-07-14 DIAGNOSIS — Z90711 Acquired absence of uterus with remaining cervical stump: Secondary | ICD-10-CM | POA: Diagnosis not present

## 2021-07-14 DIAGNOSIS — Z01411 Encounter for gynecological examination (general) (routine) with abnormal findings: Secondary | ICD-10-CM | POA: Diagnosis not present

## 2021-07-14 DIAGNOSIS — N6459 Other signs and symptoms in breast: Secondary | ICD-10-CM | POA: Diagnosis not present

## 2021-07-14 DIAGNOSIS — B373 Candidiasis of vulva and vagina: Secondary | ICD-10-CM | POA: Diagnosis not present

## 2021-07-28 DIAGNOSIS — H60541 Acute eczematoid otitis externa, right ear: Secondary | ICD-10-CM | POA: Diagnosis not present

## 2021-07-28 DIAGNOSIS — H6123 Impacted cerumen, bilateral: Secondary | ICD-10-CM | POA: Diagnosis not present

## 2021-07-29 ENCOUNTER — Other Ambulatory Visit: Payer: Self-pay | Admitting: Internal Medicine

## 2021-07-29 DIAGNOSIS — Z1231 Encounter for screening mammogram for malignant neoplasm of breast: Secondary | ICD-10-CM

## 2021-08-20 ENCOUNTER — Encounter: Payer: Self-pay | Admitting: Gastroenterology

## 2021-09-09 DIAGNOSIS — M546 Pain in thoracic spine: Secondary | ICD-10-CM | POA: Diagnosis not present

## 2021-09-10 ENCOUNTER — Other Ambulatory Visit: Payer: Self-pay

## 2021-09-10 ENCOUNTER — Ambulatory Visit
Admission: RE | Admit: 2021-09-10 | Discharge: 2021-09-10 | Disposition: A | Discharge status: Home / Self Care | Payer: Medicare Other | Source: Ambulatory Visit | Attending: Internal Medicine | Admitting: Internal Medicine

## 2021-09-10 DIAGNOSIS — Z1231 Encounter for screening mammogram for malignant neoplasm of breast: Secondary | ICD-10-CM

## 2021-10-20 DIAGNOSIS — Z1152 Encounter for screening for COVID-19: Secondary | ICD-10-CM | POA: Diagnosis not present

## 2021-10-20 DIAGNOSIS — R5383 Other fatigue: Secondary | ICD-10-CM | POA: Diagnosis not present

## 2021-10-20 DIAGNOSIS — R051 Acute cough: Secondary | ICD-10-CM | POA: Diagnosis not present

## 2021-10-20 DIAGNOSIS — R0981 Nasal congestion: Secondary | ICD-10-CM | POA: Diagnosis not present

## 2021-10-20 DIAGNOSIS — R062 Wheezing: Secondary | ICD-10-CM | POA: Diagnosis not present

## 2021-10-20 DIAGNOSIS — J069 Acute upper respiratory infection, unspecified: Secondary | ICD-10-CM | POA: Diagnosis not present

## 2021-11-02 DIAGNOSIS — H04123 Dry eye syndrome of bilateral lacrimal glands: Secondary | ICD-10-CM | POA: Diagnosis not present

## 2021-11-02 DIAGNOSIS — H524 Presbyopia: Secondary | ICD-10-CM | POA: Diagnosis not present

## 2021-12-03 DIAGNOSIS — R0981 Nasal congestion: Secondary | ICD-10-CM | POA: Diagnosis not present

## 2021-12-03 DIAGNOSIS — J019 Acute sinusitis, unspecified: Secondary | ICD-10-CM | POA: Diagnosis not present

## 2021-12-03 DIAGNOSIS — Z1152 Encounter for screening for COVID-19: Secondary | ICD-10-CM | POA: Diagnosis not present

## 2021-12-03 DIAGNOSIS — R5383 Other fatigue: Secondary | ICD-10-CM | POA: Diagnosis not present

## 2021-12-03 DIAGNOSIS — J029 Acute pharyngitis, unspecified: Secondary | ICD-10-CM | POA: Diagnosis not present

## 2021-12-03 DIAGNOSIS — R059 Cough, unspecified: Secondary | ICD-10-CM | POA: Diagnosis not present

## 2022-01-21 DIAGNOSIS — M859 Disorder of bone density and structure, unspecified: Secondary | ICD-10-CM | POA: Diagnosis not present

## 2022-01-21 DIAGNOSIS — E785 Hyperlipidemia, unspecified: Secondary | ICD-10-CM | POA: Diagnosis not present

## 2022-01-26 DIAGNOSIS — H902 Conductive hearing loss, unspecified: Secondary | ICD-10-CM | POA: Diagnosis not present

## 2022-01-26 DIAGNOSIS — H6123 Impacted cerumen, bilateral: Secondary | ICD-10-CM | POA: Diagnosis not present

## 2022-01-28 DIAGNOSIS — R030 Elevated blood-pressure reading, without diagnosis of hypertension: Secondary | ICD-10-CM | POA: Diagnosis not present

## 2022-01-28 DIAGNOSIS — Z1331 Encounter for screening for depression: Secondary | ICD-10-CM | POA: Diagnosis not present

## 2022-01-28 DIAGNOSIS — N3281 Overactive bladder: Secondary | ICD-10-CM | POA: Diagnosis not present

## 2022-01-28 DIAGNOSIS — E785 Hyperlipidemia, unspecified: Secondary | ICD-10-CM | POA: Diagnosis not present

## 2022-01-28 DIAGNOSIS — R82998 Other abnormal findings in urine: Secondary | ICD-10-CM | POA: Diagnosis not present

## 2022-01-28 DIAGNOSIS — Z0000 Encounter for general adult medical examination without abnormal findings: Secondary | ICD-10-CM | POA: Diagnosis not present

## 2022-01-28 DIAGNOSIS — M8580 Other specified disorders of bone density and structure, unspecified site: Secondary | ICD-10-CM | POA: Diagnosis not present

## 2022-02-01 ENCOUNTER — Other Ambulatory Visit: Payer: Self-pay | Admitting: Internal Medicine

## 2022-02-01 DIAGNOSIS — E785 Hyperlipidemia, unspecified: Secondary | ICD-10-CM

## 2022-02-09 DIAGNOSIS — B078 Other viral warts: Secondary | ICD-10-CM | POA: Diagnosis not present

## 2022-02-09 DIAGNOSIS — L820 Inflamed seborrheic keratosis: Secondary | ICD-10-CM | POA: Diagnosis not present

## 2022-03-19 DIAGNOSIS — Z1211 Encounter for screening for malignant neoplasm of colon: Secondary | ICD-10-CM | POA: Diagnosis not present

## 2022-04-14 DIAGNOSIS — Z1283 Encounter for screening for malignant neoplasm of skin: Secondary | ICD-10-CM | POA: Diagnosis not present

## 2022-04-14 DIAGNOSIS — C44319 Basal cell carcinoma of skin of other parts of face: Secondary | ICD-10-CM | POA: Diagnosis not present

## 2022-04-14 DIAGNOSIS — D225 Melanocytic nevi of trunk: Secondary | ICD-10-CM | POA: Diagnosis not present

## 2022-05-19 DIAGNOSIS — Z85828 Personal history of other malignant neoplasm of skin: Secondary | ICD-10-CM | POA: Diagnosis not present

## 2022-05-19 DIAGNOSIS — Z08 Encounter for follow-up examination after completed treatment for malignant neoplasm: Secondary | ICD-10-CM | POA: Diagnosis not present

## 2022-05-19 DIAGNOSIS — L820 Inflamed seborrheic keratosis: Secondary | ICD-10-CM | POA: Diagnosis not present

## 2022-08-23 ENCOUNTER — Other Ambulatory Visit: Payer: Self-pay | Admitting: Internal Medicine

## 2022-08-23 DIAGNOSIS — Z1231 Encounter for screening mammogram for malignant neoplasm of breast: Secondary | ICD-10-CM

## 2022-09-14 ENCOUNTER — Ambulatory Visit
Admission: RE | Admit: 2022-09-14 | Discharge: 2022-09-14 | Disposition: A | Discharge status: Home / Self Care | Payer: PPO | Source: Ambulatory Visit | Attending: Internal Medicine | Admitting: Internal Medicine

## 2022-09-14 DIAGNOSIS — Z1231 Encounter for screening mammogram for malignant neoplasm of breast: Secondary | ICD-10-CM

## 2022-09-15 DIAGNOSIS — L820 Inflamed seborrheic keratosis: Secondary | ICD-10-CM | POA: Diagnosis not present

## 2022-09-29 DIAGNOSIS — J029 Acute pharyngitis, unspecified: Secondary | ICD-10-CM | POA: Diagnosis not present

## 2022-09-29 DIAGNOSIS — J069 Acute upper respiratory infection, unspecified: Secondary | ICD-10-CM | POA: Diagnosis not present

## 2022-09-29 DIAGNOSIS — R5383 Other fatigue: Secondary | ICD-10-CM | POA: Diagnosis not present

## 2022-09-29 DIAGNOSIS — R0981 Nasal congestion: Secondary | ICD-10-CM | POA: Diagnosis not present

## 2022-09-29 DIAGNOSIS — Z1152 Encounter for screening for COVID-19: Secondary | ICD-10-CM | POA: Diagnosis not present

## 2022-11-08 DIAGNOSIS — Z961 Presence of intraocular lens: Secondary | ICD-10-CM | POA: Diagnosis not present

## 2022-11-08 DIAGNOSIS — H524 Presbyopia: Secondary | ICD-10-CM | POA: Diagnosis not present

## 2022-11-08 DIAGNOSIS — H04123 Dry eye syndrome of bilateral lacrimal glands: Secondary | ICD-10-CM | POA: Diagnosis not present

## 2023-01-20 DIAGNOSIS — H60331 Swimmer's ear, right ear: Secondary | ICD-10-CM | POA: Diagnosis not present

## 2023-01-20 DIAGNOSIS — H6121 Impacted cerumen, right ear: Secondary | ICD-10-CM | POA: Diagnosis not present

## 2023-01-24 DIAGNOSIS — H60331 Swimmer's ear, right ear: Secondary | ICD-10-CM | POA: Diagnosis not present

## 2023-02-01 DIAGNOSIS — R030 Elevated blood-pressure reading, without diagnosis of hypertension: Secondary | ICD-10-CM | POA: Diagnosis not present

## 2023-02-01 DIAGNOSIS — R7989 Other specified abnormal findings of blood chemistry: Secondary | ICD-10-CM | POA: Diagnosis not present

## 2023-02-01 DIAGNOSIS — E785 Hyperlipidemia, unspecified: Secondary | ICD-10-CM | POA: Diagnosis not present

## 2023-02-01 DIAGNOSIS — R5383 Other fatigue: Secondary | ICD-10-CM | POA: Diagnosis not present

## 2023-02-01 DIAGNOSIS — M8580 Other specified disorders of bone density and structure, unspecified site: Secondary | ICD-10-CM | POA: Diagnosis not present

## 2023-02-08 DIAGNOSIS — R030 Elevated blood-pressure reading, without diagnosis of hypertension: Secondary | ICD-10-CM | POA: Diagnosis not present

## 2023-02-08 DIAGNOSIS — M8580 Other specified disorders of bone density and structure, unspecified site: Secondary | ICD-10-CM | POA: Diagnosis not present

## 2023-02-08 DIAGNOSIS — Z0000 Encounter for general adult medical examination without abnormal findings: Secondary | ICD-10-CM | POA: Diagnosis not present

## 2023-02-08 DIAGNOSIS — N3281 Overactive bladder: Secondary | ICD-10-CM | POA: Diagnosis not present

## 2023-02-08 DIAGNOSIS — I872 Venous insufficiency (chronic) (peripheral): Secondary | ICD-10-CM | POA: Diagnosis not present

## 2023-02-08 DIAGNOSIS — Z1339 Encounter for screening examination for other mental health and behavioral disorders: Secondary | ICD-10-CM | POA: Diagnosis not present

## 2023-02-08 DIAGNOSIS — Z1212 Encounter for screening for malignant neoplasm of rectum: Secondary | ICD-10-CM | POA: Diagnosis not present

## 2023-02-08 DIAGNOSIS — E785 Hyperlipidemia, unspecified: Secondary | ICD-10-CM | POA: Diagnosis not present

## 2023-02-08 DIAGNOSIS — Z1331 Encounter for screening for depression: Secondary | ICD-10-CM | POA: Diagnosis not present

## 2023-02-10 ENCOUNTER — Other Ambulatory Visit: Payer: Self-pay | Admitting: Internal Medicine

## 2023-02-10 DIAGNOSIS — E785 Hyperlipidemia, unspecified: Secondary | ICD-10-CM

## 2023-03-01 DIAGNOSIS — B078 Other viral warts: Secondary | ICD-10-CM | POA: Diagnosis not present

## 2023-03-01 DIAGNOSIS — C44319 Basal cell carcinoma of skin of other parts of face: Secondary | ICD-10-CM | POA: Diagnosis not present

## 2023-03-03 DIAGNOSIS — J4521 Mild intermittent asthma with (acute) exacerbation: Secondary | ICD-10-CM | POA: Diagnosis not present

## 2023-03-03 DIAGNOSIS — R058 Other specified cough: Secondary | ICD-10-CM | POA: Diagnosis not present

## 2023-03-15 ENCOUNTER — Ambulatory Visit
Admission: RE | Admit: 2023-03-15 | Discharge: 2023-03-15 | Disposition: A | Discharge status: Home / Self Care | Payer: PPO | Source: Ambulatory Visit | Attending: Internal Medicine | Admitting: Internal Medicine

## 2023-03-15 DIAGNOSIS — E785 Hyperlipidemia, unspecified: Secondary | ICD-10-CM

## 2023-03-15 DIAGNOSIS — I700 Atherosclerosis of aorta: Secondary | ICD-10-CM | POA: Diagnosis not present

## 2023-04-12 DIAGNOSIS — D1801 Hemangioma of skin and subcutaneous tissue: Secondary | ICD-10-CM | POA: Diagnosis not present

## 2023-04-12 DIAGNOSIS — C44319 Basal cell carcinoma of skin of other parts of face: Secondary | ICD-10-CM | POA: Diagnosis not present

## 2023-04-21 DIAGNOSIS — E785 Hyperlipidemia, unspecified: Secondary | ICD-10-CM | POA: Diagnosis not present

## 2023-04-21 DIAGNOSIS — R030 Elevated blood-pressure reading, without diagnosis of hypertension: Secondary | ICD-10-CM | POA: Diagnosis not present

## 2023-05-24 DIAGNOSIS — Z85828 Personal history of other malignant neoplasm of skin: Secondary | ICD-10-CM | POA: Diagnosis not present

## 2023-05-24 DIAGNOSIS — Z08 Encounter for follow-up examination after completed treatment for malignant neoplasm: Secondary | ICD-10-CM | POA: Diagnosis not present

## 2023-07-26 DIAGNOSIS — H6123 Impacted cerumen, bilateral: Secondary | ICD-10-CM | POA: Diagnosis not present

## 2023-07-26 DIAGNOSIS — H902 Conductive hearing loss, unspecified: Secondary | ICD-10-CM | POA: Diagnosis not present

## 2023-09-14 DIAGNOSIS — Z85828 Personal history of other malignant neoplasm of skin: Secondary | ICD-10-CM | POA: Diagnosis not present

## 2023-09-14 DIAGNOSIS — L820 Inflamed seborrheic keratosis: Secondary | ICD-10-CM | POA: Diagnosis not present

## 2023-09-14 DIAGNOSIS — Z08 Encounter for follow-up examination after completed treatment for malignant neoplasm: Secondary | ICD-10-CM | POA: Diagnosis not present

## 2023-09-14 DIAGNOSIS — C44319 Basal cell carcinoma of skin of other parts of face: Secondary | ICD-10-CM | POA: Diagnosis not present

## 2023-09-30 ENCOUNTER — Other Ambulatory Visit: Payer: Self-pay | Admitting: Internal Medicine

## 2023-09-30 DIAGNOSIS — Z1231 Encounter for screening mammogram for malignant neoplasm of breast: Secondary | ICD-10-CM

## 2023-11-01 DIAGNOSIS — J069 Acute upper respiratory infection, unspecified: Secondary | ICD-10-CM | POA: Diagnosis not present

## 2023-11-01 DIAGNOSIS — R058 Other specified cough: Secondary | ICD-10-CM | POA: Diagnosis not present

## 2023-11-02 DIAGNOSIS — Z08 Encounter for follow-up examination after completed treatment for malignant neoplasm: Secondary | ICD-10-CM | POA: Diagnosis not present

## 2023-11-02 DIAGNOSIS — Z85828 Personal history of other malignant neoplasm of skin: Secondary | ICD-10-CM | POA: Diagnosis not present

## 2023-11-02 DIAGNOSIS — L820 Inflamed seborrheic keratosis: Secondary | ICD-10-CM | POA: Diagnosis not present

## 2023-11-02 DIAGNOSIS — L218 Other seborrheic dermatitis: Secondary | ICD-10-CM | POA: Diagnosis not present

## 2023-11-03 ENCOUNTER — Ambulatory Visit
Admission: RE | Admit: 2023-11-03 | Discharge: 2023-11-03 | Disposition: A | Discharge status: Home / Self Care | Payer: PPO | Source: Ambulatory Visit | Attending: Internal Medicine | Admitting: Internal Medicine

## 2023-11-03 DIAGNOSIS — Z1231 Encounter for screening mammogram for malignant neoplasm of breast: Secondary | ICD-10-CM

## 2023-12-02 DIAGNOSIS — H5211 Myopia, right eye: Secondary | ICD-10-CM | POA: Diagnosis not present

## 2023-12-02 DIAGNOSIS — H2513 Age-related nuclear cataract, bilateral: Secondary | ICD-10-CM | POA: Diagnosis not present

## 2024-01-24 DIAGNOSIS — H6123 Impacted cerumen, bilateral: Secondary | ICD-10-CM | POA: Diagnosis not present

## 2024-01-24 DIAGNOSIS — H902 Conductive hearing loss, unspecified: Secondary | ICD-10-CM | POA: Diagnosis not present

## 2024-02-14 DIAGNOSIS — E785 Hyperlipidemia, unspecified: Secondary | ICD-10-CM | POA: Diagnosis not present

## 2024-02-14 DIAGNOSIS — R030 Elevated blood-pressure reading, without diagnosis of hypertension: Secondary | ICD-10-CM | POA: Diagnosis not present

## 2024-02-14 DIAGNOSIS — R7989 Other specified abnormal findings of blood chemistry: Secondary | ICD-10-CM | POA: Diagnosis not present

## 2024-02-14 DIAGNOSIS — M8580 Other specified disorders of bone density and structure, unspecified site: Secondary | ICD-10-CM | POA: Diagnosis not present

## 2024-02-14 DIAGNOSIS — Z1212 Encounter for screening for malignant neoplasm of rectum: Secondary | ICD-10-CM | POA: Diagnosis not present

## 2024-02-16 DIAGNOSIS — R82998 Other abnormal findings in urine: Secondary | ICD-10-CM | POA: Diagnosis not present

## 2024-02-21 DIAGNOSIS — N3281 Overactive bladder: Secondary | ICD-10-CM | POA: Diagnosis not present

## 2024-02-21 DIAGNOSIS — I872 Venous insufficiency (chronic) (peripheral): Secondary | ICD-10-CM | POA: Diagnosis not present

## 2024-02-21 DIAGNOSIS — Z0000 Encounter for general adult medical examination without abnormal findings: Secondary | ICD-10-CM | POA: Diagnosis not present

## 2024-02-21 DIAGNOSIS — E785 Hyperlipidemia, unspecified: Secondary | ICD-10-CM | POA: Diagnosis not present

## 2024-02-21 DIAGNOSIS — R030 Elevated blood-pressure reading, without diagnosis of hypertension: Secondary | ICD-10-CM | POA: Diagnosis not present

## 2024-02-21 DIAGNOSIS — M8580 Other specified disorders of bone density and structure, unspecified site: Secondary | ICD-10-CM | POA: Diagnosis not present

## 2024-02-21 DIAGNOSIS — Z1339 Encounter for screening examination for other mental health and behavioral disorders: Secondary | ICD-10-CM | POA: Diagnosis not present

## 2024-02-21 DIAGNOSIS — Z1331 Encounter for screening for depression: Secondary | ICD-10-CM | POA: Diagnosis not present

## 2024-04-10 DIAGNOSIS — L820 Inflamed seborrheic keratosis: Secondary | ICD-10-CM | POA: Diagnosis not present

## 2024-04-10 DIAGNOSIS — I781 Nevus, non-neoplastic: Secondary | ICD-10-CM | POA: Diagnosis not present

## 2024-06-19 DIAGNOSIS — L218 Other seborrheic dermatitis: Secondary | ICD-10-CM | POA: Diagnosis not present

## 2024-06-19 DIAGNOSIS — L820 Inflamed seborrheic keratosis: Secondary | ICD-10-CM | POA: Diagnosis not present

## 2024-07-25 DIAGNOSIS — H6123 Impacted cerumen, bilateral: Secondary | ICD-10-CM | POA: Diagnosis not present

## 2024-07-25 DIAGNOSIS — H902 Conductive hearing loss, unspecified: Secondary | ICD-10-CM | POA: Diagnosis not present

## 2024-08-30 DIAGNOSIS — H60331 Swimmer's ear, right ear: Secondary | ICD-10-CM | POA: Diagnosis not present

## 2024-10-26 DIAGNOSIS — X32XXXD Exposure to sunlight, subsequent encounter: Secondary | ICD-10-CM | POA: Diagnosis not present

## 2024-10-26 DIAGNOSIS — L820 Inflamed seborrheic keratosis: Secondary | ICD-10-CM | POA: Diagnosis not present

## 2024-10-26 DIAGNOSIS — L570 Actinic keratosis: Secondary | ICD-10-CM | POA: Diagnosis not present
